# Patient Record
Sex: Female | Born: 1965 | Race: Black or African American | Hispanic: No | Marital: Single | Smoking: Former smoker
Health system: Southern US, Community
[De-identification: ages and names within clinical notes are randomized; demographics above are authoritative.]

## PROBLEM LIST (undated history)

## (undated) DIAGNOSIS — J45909 Unspecified asthma, uncomplicated: Secondary | ICD-10-CM

---

## 1997-05-17 ENCOUNTER — Ambulatory Visit (HOSPITAL_COMMUNITY): Admission: RE | Admit: 1997-05-17 | Discharge: 1997-05-17 | Payer: Self-pay | Admitting: Obstetrics

## 1997-09-09 ENCOUNTER — Emergency Department (HOSPITAL_COMMUNITY): Admission: EM | Admit: 1997-09-09 | Discharge: 1997-09-09 | Payer: Self-pay | Admitting: Emergency Medicine

## 1997-09-09 ENCOUNTER — Ambulatory Visit (HOSPITAL_COMMUNITY): Admission: RE | Admit: 1997-09-09 | Discharge: 1997-09-09 | Payer: Self-pay | Admitting: *Deleted

## 1997-12-23 ENCOUNTER — Ambulatory Visit (HOSPITAL_COMMUNITY): Admission: RE | Admit: 1997-12-23 | Discharge: 1997-12-23 | Payer: Self-pay | Admitting: *Deleted

## 1998-05-18 ENCOUNTER — Ambulatory Visit (HOSPITAL_COMMUNITY): Admission: RE | Admit: 1998-05-18 | Discharge: 1998-05-18 | Payer: Self-pay | Admitting: Obstetrics

## 1998-05-18 ENCOUNTER — Other Ambulatory Visit: Admission: RE | Admit: 1998-05-18 | Discharge: 1998-05-18 | Payer: Self-pay | Admitting: Obstetrics

## 1998-06-09 ENCOUNTER — Emergency Department (HOSPITAL_COMMUNITY): Admission: EM | Admit: 1998-06-09 | Discharge: 1998-06-09 | Payer: Self-pay | Admitting: Emergency Medicine

## 1998-10-31 ENCOUNTER — Emergency Department (HOSPITAL_COMMUNITY): Admission: EM | Admit: 1998-10-31 | Discharge: 1998-10-31 | Payer: Self-pay | Admitting: Emergency Medicine

## 1998-10-31 ENCOUNTER — Encounter: Payer: Self-pay | Admitting: Emergency Medicine

## 1998-12-15 ENCOUNTER — Encounter: Payer: Self-pay | Admitting: Obstetrics

## 1998-12-15 ENCOUNTER — Ambulatory Visit (HOSPITAL_COMMUNITY): Admission: RE | Admit: 1998-12-15 | Discharge: 1998-12-15 | Payer: Self-pay | Admitting: Obstetrics

## 1999-06-22 ENCOUNTER — Emergency Department (HOSPITAL_COMMUNITY): Admission: EM | Admit: 1999-06-22 | Discharge: 1999-06-22 | Payer: Self-pay | Admitting: Emergency Medicine

## 1999-07-17 ENCOUNTER — Other Ambulatory Visit: Admission: RE | Admit: 1999-07-17 | Discharge: 1999-07-17 | Payer: Self-pay | Admitting: Obstetrics

## 1999-10-04 ENCOUNTER — Emergency Department (HOSPITAL_COMMUNITY): Admission: EM | Admit: 1999-10-04 | Discharge: 1999-10-04 | Payer: Self-pay | Admitting: Emergency Medicine

## 1999-10-09 ENCOUNTER — Emergency Department (HOSPITAL_COMMUNITY): Admission: EM | Admit: 1999-10-09 | Discharge: 1999-10-09 | Payer: Self-pay | Admitting: Emergency Medicine

## 1999-10-09 ENCOUNTER — Encounter: Payer: Self-pay | Admitting: Emergency Medicine

## 1999-12-18 ENCOUNTER — Ambulatory Visit (HOSPITAL_COMMUNITY): Admission: RE | Admit: 1999-12-18 | Discharge: 1999-12-18 | Payer: Self-pay | Admitting: Obstetrics

## 1999-12-18 ENCOUNTER — Encounter: Payer: Self-pay | Admitting: Obstetrics

## 2000-04-15 ENCOUNTER — Other Ambulatory Visit: Admission: RE | Admit: 2000-04-15 | Discharge: 2000-04-15 | Payer: Self-pay | Admitting: Obstetrics

## 2000-10-21 ENCOUNTER — Emergency Department (HOSPITAL_COMMUNITY): Admission: EM | Admit: 2000-10-21 | Discharge: 2000-10-21 | Payer: Self-pay | Admitting: Emergency Medicine

## 2001-05-29 ENCOUNTER — Ambulatory Visit (HOSPITAL_BASED_OUTPATIENT_CLINIC_OR_DEPARTMENT_OTHER): Admission: RE | Admit: 2001-05-29 | Discharge: 2001-05-29 | Payer: Self-pay | Admitting: Urology

## 2001-12-03 ENCOUNTER — Ambulatory Visit (HOSPITAL_COMMUNITY): Admission: RE | Admit: 2001-12-03 | Discharge: 2001-12-03 | Payer: Self-pay | Admitting: Obstetrics

## 2001-12-03 ENCOUNTER — Encounter: Payer: Self-pay | Admitting: Obstetrics

## 2003-12-08 ENCOUNTER — Encounter (INDEPENDENT_AMBULATORY_CARE_PROVIDER_SITE_OTHER): Payer: Self-pay | Admitting: *Deleted

## 2003-12-08 LAB — CONVERTED CEMR LAB

## 2004-01-05 ENCOUNTER — Ambulatory Visit: Payer: Self-pay | Admitting: Sports Medicine

## 2004-01-09 ENCOUNTER — Ambulatory Visit: Payer: Self-pay | Admitting: Family Medicine

## 2004-01-12 ENCOUNTER — Ambulatory Visit (HOSPITAL_COMMUNITY): Admission: RE | Admit: 2004-01-12 | Discharge: 2004-01-12 | Payer: Self-pay | Admitting: Family Medicine

## 2004-01-19 ENCOUNTER — Encounter: Admission: RE | Admit: 2004-01-19 | Discharge: 2004-01-19 | Payer: Self-pay | Admitting: Family Medicine

## 2004-05-02 ENCOUNTER — Ambulatory Visit: Payer: Self-pay | Admitting: Family Medicine

## 2004-05-07 ENCOUNTER — Ambulatory Visit: Payer: Self-pay | Admitting: Family Medicine

## 2004-05-09 ENCOUNTER — Ambulatory Visit: Payer: Self-pay | Admitting: Family Medicine

## 2004-05-17 ENCOUNTER — Ambulatory Visit: Payer: Self-pay | Admitting: Family Medicine

## 2004-05-29 ENCOUNTER — Ambulatory Visit: Payer: Self-pay | Admitting: Obstetrics and Gynecology

## 2004-08-07 ENCOUNTER — Ambulatory Visit: Payer: Self-pay | Admitting: Obstetrics and Gynecology

## 2004-08-21 ENCOUNTER — Ambulatory Visit: Payer: Self-pay | Admitting: Obstetrics and Gynecology

## 2005-01-22 ENCOUNTER — Encounter: Payer: Self-pay | Admitting: Obstetrics and Gynecology

## 2005-01-22 ENCOUNTER — Ambulatory Visit: Payer: Self-pay | Admitting: Obstetrics and Gynecology

## 2005-02-18 ENCOUNTER — Ambulatory Visit: Payer: Self-pay | Admitting: Family Medicine

## 2005-04-17 ENCOUNTER — Ambulatory Visit: Payer: Self-pay | Admitting: Family Medicine

## 2005-04-21 ENCOUNTER — Emergency Department (HOSPITAL_COMMUNITY): Admission: EM | Admit: 2005-04-21 | Discharge: 2005-04-21 | Payer: Self-pay | Admitting: Emergency Medicine

## 2005-04-29 ENCOUNTER — Ambulatory Visit: Payer: Self-pay | Admitting: Sports Medicine

## 2005-05-01 ENCOUNTER — Encounter: Admission: RE | Admit: 2005-05-01 | Discharge: 2005-05-01 | Payer: Self-pay | Admitting: Sports Medicine

## 2005-05-07 ENCOUNTER — Ambulatory Visit: Payer: Self-pay | Admitting: Sports Medicine

## 2005-05-09 ENCOUNTER — Ambulatory Visit: Payer: Self-pay | Admitting: Family Medicine

## 2005-05-24 ENCOUNTER — Ambulatory Visit (HOSPITAL_BASED_OUTPATIENT_CLINIC_OR_DEPARTMENT_OTHER): Admission: RE | Admit: 2005-05-24 | Discharge: 2005-05-24 | Payer: Self-pay | Admitting: Family Medicine

## 2005-06-02 ENCOUNTER — Ambulatory Visit: Payer: Self-pay | Admitting: Internal Medicine

## 2005-06-03 ENCOUNTER — Ambulatory Visit: Payer: Self-pay | Admitting: Family Medicine

## 2005-06-24 ENCOUNTER — Ambulatory Visit: Payer: Self-pay | Admitting: Family Medicine

## 2005-09-05 ENCOUNTER — Ambulatory Visit: Payer: Self-pay | Admitting: Obstetrics and Gynecology

## 2005-09-05 ENCOUNTER — Ambulatory Visit (HOSPITAL_COMMUNITY): Admission: RE | Admit: 2005-09-05 | Discharge: 2005-09-05 | Payer: Self-pay | Admitting: Obstetrics and Gynecology

## 2005-10-31 ENCOUNTER — Ambulatory Visit: Payer: Self-pay | Admitting: Obstetrics and Gynecology

## 2005-11-29 ENCOUNTER — Observation Stay (HOSPITAL_COMMUNITY): Admission: EM | Admit: 2005-11-29 | Discharge: 2005-11-30 | Payer: Self-pay | Admitting: Emergency Medicine

## 2005-12-13 ENCOUNTER — Ambulatory Visit: Payer: Self-pay | Admitting: Hematology and Oncology

## 2005-12-16 ENCOUNTER — Encounter: Admission: RE | Admit: 2005-12-16 | Discharge: 2005-12-16 | Payer: Self-pay | Admitting: Cardiology

## 2005-12-19 LAB — CBC WITH DIFFERENTIAL/PLATELET
BASO%: 0.4 % (ref 0.0–2.0)
Basophils Absolute: 0 10*3/uL (ref 0.0–0.1)
EOS%: 1.1 % (ref 0.0–7.0)
Eosinophils Absolute: 0.1 10*3/uL (ref 0.0–0.5)
HCT: 42.2 % (ref 34.8–46.6)
HGB: 14.5 g/dL (ref 11.6–15.9)
LYMPH%: 35.2 % (ref 14.0–48.0)
MCH: 32 pg (ref 26.0–34.0)
MCHC: 34.4 g/dL (ref 32.0–36.0)
MCV: 93.3 fL (ref 81.0–101.0)
MONO#: 0.5 10*3/uL (ref 0.1–0.9)
MONO%: 6.6 % (ref 0.0–13.0)
NEUT#: 4.4 10*3/uL (ref 1.5–6.5)
NEUT%: 56.7 % (ref 39.6–76.8)
Platelets: 341 10*3/uL (ref 145–400)
RBC: 4.52 10*6/uL (ref 3.70–5.32)
RDW: 12.8 % (ref 11.3–14.5)
WBC: 7.7 10*3/uL (ref 3.9–10.0)
lymph#: 2.7 10*3/uL (ref 0.9–3.3)

## 2005-12-20 LAB — HEPATITIS B CORE ANTIBODY, IGM: Hep B C IgM: NEGATIVE

## 2005-12-20 LAB — HEPATITIS B SURFACE ANTIGEN: Hepatitis B Surface Ag: NEGATIVE

## 2005-12-20 LAB — HEPATITIS B SURFACE ANTIBODY,QUALITATIVE: Hep B S Ab: POSITIVE — AB

## 2005-12-20 LAB — ANA: Anti Nuclear Antibody(ANA): NEGATIVE

## 2005-12-20 LAB — HEPATITIS C ANTIBODY: HCV Ab: NEGATIVE

## 2005-12-23 LAB — COMPREHENSIVE METABOLIC PANEL
ALT: 16 U/L (ref 0–40)
AST: 14 U/L (ref 0–37)
Albumin: 4.1 g/dL (ref 3.5–5.2)
Alkaline Phosphatase: 52 U/L (ref 39–117)
BUN: 13 mg/dL (ref 6–23)
CO2: 27 mEq/L (ref 19–32)
Calcium: 9.1 mg/dL (ref 8.4–10.5)
Chloride: 106 mEq/L (ref 96–112)
Creatinine, Ser: 0.83 mg/dL (ref 0.40–1.20)
Glucose, Bld: 114 mg/dL — ABNORMAL HIGH (ref 70–99)
Potassium: 4.1 mEq/L (ref 3.5–5.3)
Sodium: 140 mEq/L (ref 135–145)
Total Bilirubin: 0.6 mg/dL (ref 0.3–1.2)
Total Protein: 6.6 g/dL (ref 6.0–8.3)

## 2005-12-23 LAB — LACTATE DEHYDROGENASE: LDH: 170 U/L (ref 94–250)

## 2005-12-24 LAB — OTHER SOLSTAS TEST

## 2006-03-12 ENCOUNTER — Encounter: Payer: Self-pay | Admitting: Obstetrics and Gynecology

## 2006-03-12 ENCOUNTER — Ambulatory Visit: Payer: Self-pay | Admitting: Obstetrics & Gynecology

## 2006-04-18 ENCOUNTER — Emergency Department (HOSPITAL_COMMUNITY): Admission: EM | Admit: 2006-04-18 | Discharge: 2006-04-18 | Payer: Self-pay | Admitting: Emergency Medicine

## 2006-05-05 ENCOUNTER — Ambulatory Visit (HOSPITAL_COMMUNITY): Admission: RE | Admit: 2006-05-05 | Discharge: 2006-05-05 | Payer: Self-pay | Admitting: Family Medicine

## 2006-06-05 DIAGNOSIS — G2589 Other specified extrapyramidal and movement disorders: Secondary | ICD-10-CM | POA: Insufficient documentation

## 2006-06-05 DIAGNOSIS — E669 Obesity, unspecified: Secondary | ICD-10-CM | POA: Insufficient documentation

## 2006-06-05 DIAGNOSIS — K219 Gastro-esophageal reflux disease without esophagitis: Secondary | ICD-10-CM | POA: Insufficient documentation

## 2006-06-05 DIAGNOSIS — N946 Dysmenorrhea, unspecified: Secondary | ICD-10-CM | POA: Insufficient documentation

## 2006-06-05 DIAGNOSIS — G4730 Sleep apnea, unspecified: Secondary | ICD-10-CM | POA: Insufficient documentation

## 2006-06-05 DIAGNOSIS — G43909 Migraine, unspecified, not intractable, without status migrainosus: Secondary | ICD-10-CM | POA: Insufficient documentation

## 2006-06-06 ENCOUNTER — Encounter (INDEPENDENT_AMBULATORY_CARE_PROVIDER_SITE_OTHER): Payer: Self-pay | Admitting: *Deleted

## 2006-07-15 ENCOUNTER — Telehealth: Payer: Self-pay | Admitting: *Deleted

## 2006-07-15 ENCOUNTER — Ambulatory Visit: Payer: Self-pay | Admitting: Family Medicine

## 2006-07-15 ENCOUNTER — Encounter (INDEPENDENT_AMBULATORY_CARE_PROVIDER_SITE_OTHER): Payer: Self-pay | Admitting: Family Medicine

## 2006-07-15 DIAGNOSIS — N920 Excessive and frequent menstruation with regular cycle: Secondary | ICD-10-CM | POA: Insufficient documentation

## 2006-07-15 DIAGNOSIS — R1032 Left lower quadrant pain: Secondary | ICD-10-CM | POA: Insufficient documentation

## 2006-07-15 LAB — CONVERTED CEMR LAB
Bilirubin Urine: NEGATIVE
Glucose, Urine, Semiquant: NEGATIVE
HCT: 41.6 %
Hemoglobin: 14.2 g/dL
MCV: 93.2 fL
Nitrite: NEGATIVE
Platelets: 315 10*3/uL
Protein, U semiquant: 30
RBC: 4.46 M/uL
Specific Gravity, Urine: >=1.03
Urobilinogen, UA: 0.2
WBC Urine, dipstick: NEGATIVE
WBC: 11.9 10*3/uL
pH: 6.5

## 2006-07-16 ENCOUNTER — Encounter: Payer: Self-pay | Admitting: *Deleted

## 2006-07-16 ENCOUNTER — Telehealth: Payer: Self-pay | Admitting: *Deleted

## 2006-07-16 ENCOUNTER — Emergency Department (HOSPITAL_COMMUNITY): Admission: EM | Admit: 2006-07-16 | Discharge: 2006-07-16 | Payer: Self-pay | Admitting: Family Medicine

## 2006-07-16 DIAGNOSIS — R109 Unspecified abdominal pain: Secondary | ICD-10-CM | POA: Insufficient documentation

## 2006-07-18 ENCOUNTER — Other Ambulatory Visit: Admission: RE | Admit: 2006-07-18 | Discharge: 2006-07-18 | Payer: Self-pay | Admitting: Obstetrics and Gynecology

## 2006-07-18 ENCOUNTER — Ambulatory Visit: Payer: Self-pay | Admitting: Family Medicine

## 2006-07-18 ENCOUNTER — Encounter (INDEPENDENT_AMBULATORY_CARE_PROVIDER_SITE_OTHER): Payer: Self-pay | Admitting: Specialist

## 2006-07-21 ENCOUNTER — Ambulatory Visit (HOSPITAL_COMMUNITY): Admission: RE | Admit: 2006-07-21 | Discharge: 2006-07-21 | Payer: Self-pay | Admitting: Family Medicine

## 2006-07-30 ENCOUNTER — Ambulatory Visit: Payer: Self-pay | Admitting: Family Medicine

## 2006-08-07 ENCOUNTER — Ambulatory Visit: Payer: Self-pay | Admitting: Sports Medicine

## 2006-08-07 ENCOUNTER — Encounter: Payer: Self-pay | Admitting: Family Medicine

## 2006-08-07 DIAGNOSIS — M549 Dorsalgia, unspecified: Secondary | ICD-10-CM | POA: Insufficient documentation

## 2006-08-07 DIAGNOSIS — J309 Allergic rhinitis, unspecified: Secondary | ICD-10-CM | POA: Insufficient documentation

## 2006-09-03 ENCOUNTER — Ambulatory Visit: Payer: Self-pay | Admitting: Family Medicine

## 2006-09-03 ENCOUNTER — Inpatient Hospital Stay (HOSPITAL_COMMUNITY): Admission: RE | Admit: 2006-09-03 | Discharge: 2006-09-05 | Payer: Self-pay | Admitting: Family Medicine

## 2006-09-03 ENCOUNTER — Encounter: Payer: Self-pay | Admitting: Family Medicine

## 2006-09-24 ENCOUNTER — Ambulatory Visit: Payer: Self-pay | Admitting: Obstetrics & Gynecology

## 2006-10-03 ENCOUNTER — Telehealth: Payer: Self-pay | Admitting: Family Medicine

## 2006-10-03 ENCOUNTER — Inpatient Hospital Stay (HOSPITAL_COMMUNITY): Admission: AD | Admit: 2006-10-03 | Discharge: 2006-10-03 | Payer: Self-pay | Admitting: Family Medicine

## 2006-10-21 ENCOUNTER — Ambulatory Visit: Payer: Self-pay | Admitting: Family Medicine

## 2006-10-21 ENCOUNTER — Encounter: Payer: Self-pay | Admitting: Family Medicine

## 2006-10-21 DIAGNOSIS — F329 Major depressive disorder, single episode, unspecified: Secondary | ICD-10-CM | POA: Insufficient documentation

## 2006-10-22 ENCOUNTER — Encounter: Admission: RE | Admit: 2006-10-22 | Discharge: 2006-10-23 | Payer: Self-pay | Admitting: Family Medicine

## 2006-10-23 ENCOUNTER — Ambulatory Visit: Payer: Self-pay | Admitting: Family Medicine

## 2006-10-23 ENCOUNTER — Ambulatory Visit: Payer: Self-pay | Admitting: Obstetrics and Gynecology

## 2006-10-26 ENCOUNTER — Encounter: Payer: Self-pay | Admitting: Family Medicine

## 2006-10-26 ENCOUNTER — Telehealth: Payer: Self-pay | Admitting: Family Medicine

## 2006-11-14 ENCOUNTER — Encounter: Payer: Self-pay | Admitting: *Deleted

## 2006-11-20 ENCOUNTER — Ambulatory Visit: Payer: Self-pay | Admitting: *Deleted

## 2006-11-27 ENCOUNTER — Telehealth: Payer: Self-pay | Admitting: *Deleted

## 2006-11-28 ENCOUNTER — Encounter (INDEPENDENT_AMBULATORY_CARE_PROVIDER_SITE_OTHER): Payer: Self-pay | Admitting: *Deleted

## 2006-11-28 ENCOUNTER — Ambulatory Visit: Payer: Self-pay | Admitting: Family Medicine

## 2006-12-04 ENCOUNTER — Encounter: Payer: Self-pay | Admitting: Family Medicine

## 2006-12-04 ENCOUNTER — Ambulatory Visit: Payer: Self-pay | Admitting: Family Medicine

## 2006-12-04 DIAGNOSIS — R5381 Other malaise: Secondary | ICD-10-CM

## 2006-12-04 DIAGNOSIS — R5383 Other fatigue: Secondary | ICD-10-CM | POA: Insufficient documentation

## 2006-12-04 DIAGNOSIS — N760 Acute vaginitis: Secondary | ICD-10-CM | POA: Insufficient documentation

## 2006-12-04 LAB — CONVERTED CEMR LAB
HCT: 42.2 % (ref 36.0–46.0)
Hemoglobin: 14.3 g/dL (ref 12.0–15.0)
MCHC: 33.9 g/dL (ref 30.0–36.0)
MCV: 91.9 fL (ref 78.0–100.0)
Platelets: 322 10*3/uL (ref 150–400)
RBC: 4.59 M/uL (ref 3.87–5.11)
RDW: 13.4 % (ref 11.5–14.0)
WBC: 8.6 10*3/uL (ref 4.0–10.5)

## 2006-12-05 ENCOUNTER — Telehealth: Payer: Self-pay | Admitting: Family Medicine

## 2006-12-10 ENCOUNTER — Encounter: Payer: Self-pay | Admitting: Family Medicine

## 2007-01-22 ENCOUNTER — Telehealth: Payer: Self-pay | Admitting: *Deleted

## 2007-01-28 ENCOUNTER — Ambulatory Visit: Payer: Self-pay | Admitting: Family Medicine

## 2007-01-28 ENCOUNTER — Encounter: Payer: Self-pay | Admitting: Family Medicine

## 2007-01-28 DIAGNOSIS — H538 Other visual disturbances: Secondary | ICD-10-CM | POA: Insufficient documentation

## 2007-01-28 LAB — CONVERTED CEMR LAB
Cholesterol: 189 mg/dL (ref 0–200)
HDL: 65 mg/dL (ref >39)
LDL Cholesterol: 115 mg/dL — ABNORMAL HIGH (ref 0–99)
TSH: 1.712 microintl units/mL (ref 0.350–5.50)
Total CHOL/HDL Ratio: 2.9
Triglycerides: 45 mg/dL (ref <150)
VLDL: 9 mg/dL (ref 0–40)

## 2007-02-03 ENCOUNTER — Telehealth: Payer: Self-pay | Admitting: *Deleted

## 2007-02-05 ENCOUNTER — Telehealth: Payer: Self-pay | Admitting: *Deleted

## 2007-03-02 ENCOUNTER — Telehealth: Payer: Self-pay | Admitting: *Deleted

## 2007-03-24 ENCOUNTER — Telehealth: Payer: Self-pay | Admitting: *Deleted

## 2007-03-26 ENCOUNTER — Encounter: Payer: Self-pay | Admitting: Family Medicine

## 2007-03-26 ENCOUNTER — Ambulatory Visit: Payer: Self-pay | Admitting: Family Medicine

## 2007-03-26 DIAGNOSIS — A6000 Herpesviral infection of urogenital system, unspecified: Secondary | ICD-10-CM | POA: Insufficient documentation

## 2007-03-31 ENCOUNTER — Encounter: Payer: Self-pay | Admitting: Family Medicine

## 2007-04-13 ENCOUNTER — Telehealth: Payer: Self-pay | Admitting: *Deleted

## 2007-04-15 ENCOUNTER — Ambulatory Visit: Payer: Self-pay | Admitting: Family Medicine

## 2007-04-15 ENCOUNTER — Encounter (INDEPENDENT_AMBULATORY_CARE_PROVIDER_SITE_OTHER): Payer: Self-pay | Admitting: Family Medicine

## 2007-04-15 LAB — CONVERTED CEMR LAB
Chlamydia, DNA Probe: NEGATIVE
GC Probe Amp, Genital: NEGATIVE
Whiff Test: NEGATIVE

## 2007-04-16 ENCOUNTER — Telehealth: Payer: Self-pay | Admitting: *Deleted

## 2007-04-21 ENCOUNTER — Telehealth (INDEPENDENT_AMBULATORY_CARE_PROVIDER_SITE_OTHER): Payer: Self-pay | Admitting: Family Medicine

## 2007-05-12 ENCOUNTER — Telehealth (INDEPENDENT_AMBULATORY_CARE_PROVIDER_SITE_OTHER): Payer: Self-pay | Admitting: *Deleted

## 2007-06-05 ENCOUNTER — Ambulatory Visit (HOSPITAL_COMMUNITY): Admission: RE | Admit: 2007-06-05 | Discharge: 2007-06-05 | Payer: Self-pay | Admitting: Family Medicine

## 2007-08-25 ENCOUNTER — Telehealth: Payer: Self-pay | Admitting: *Deleted

## 2007-09-21 ENCOUNTER — Telehealth (INDEPENDENT_AMBULATORY_CARE_PROVIDER_SITE_OTHER): Payer: Self-pay | Admitting: *Deleted

## 2007-12-01 ENCOUNTER — Telehealth: Payer: Self-pay | Admitting: *Deleted

## 2007-12-02 ENCOUNTER — Encounter (INDEPENDENT_AMBULATORY_CARE_PROVIDER_SITE_OTHER): Payer: Self-pay | Admitting: *Deleted

## 2008-01-23 IMAGING — CR DG CHEST 2V
2 series · 2 of 2 positions shown · non-contrast
Comparison: none

CLINICAL DATA: Severe night sweats.  Nonsmoker.
 CHEST - 2 VIEW: 
 PA and lateral chest -  09/05/05. 
 No prior studies are available for comparison.

[view not recorded (1 of 2)]
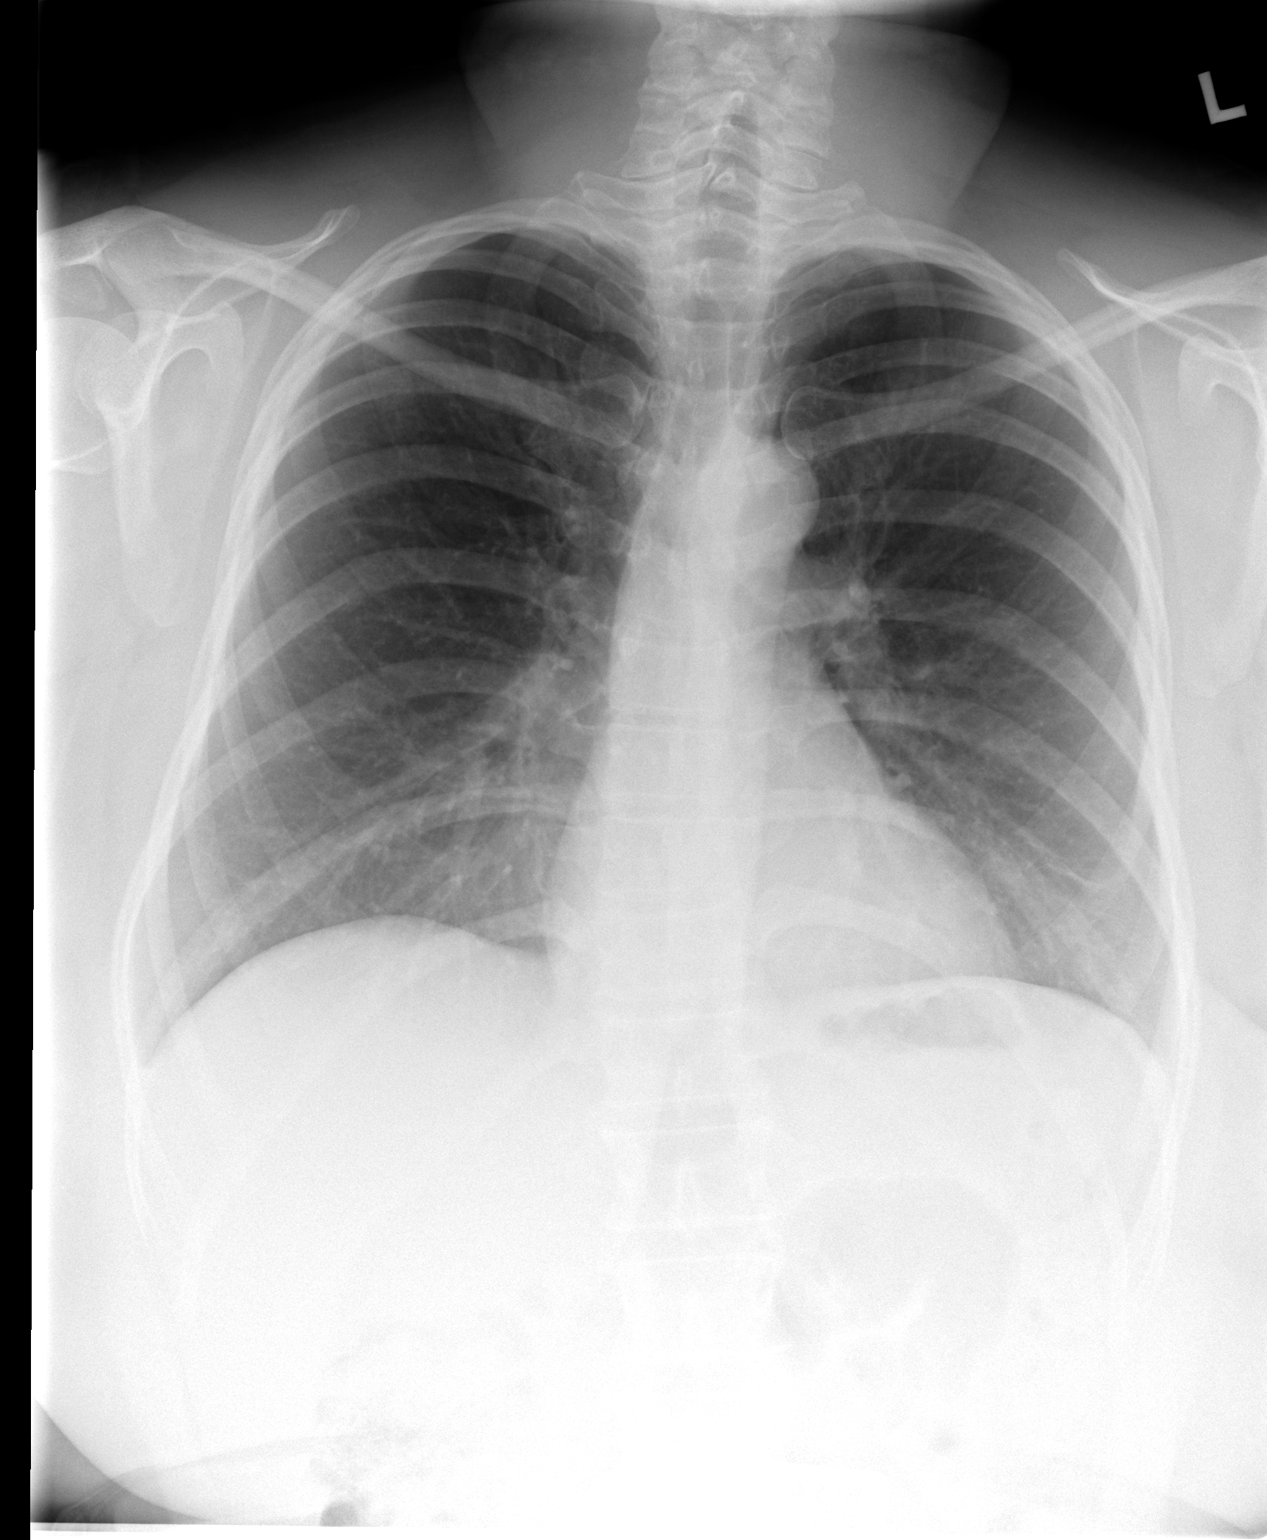

[view not recorded (2 of 2)]
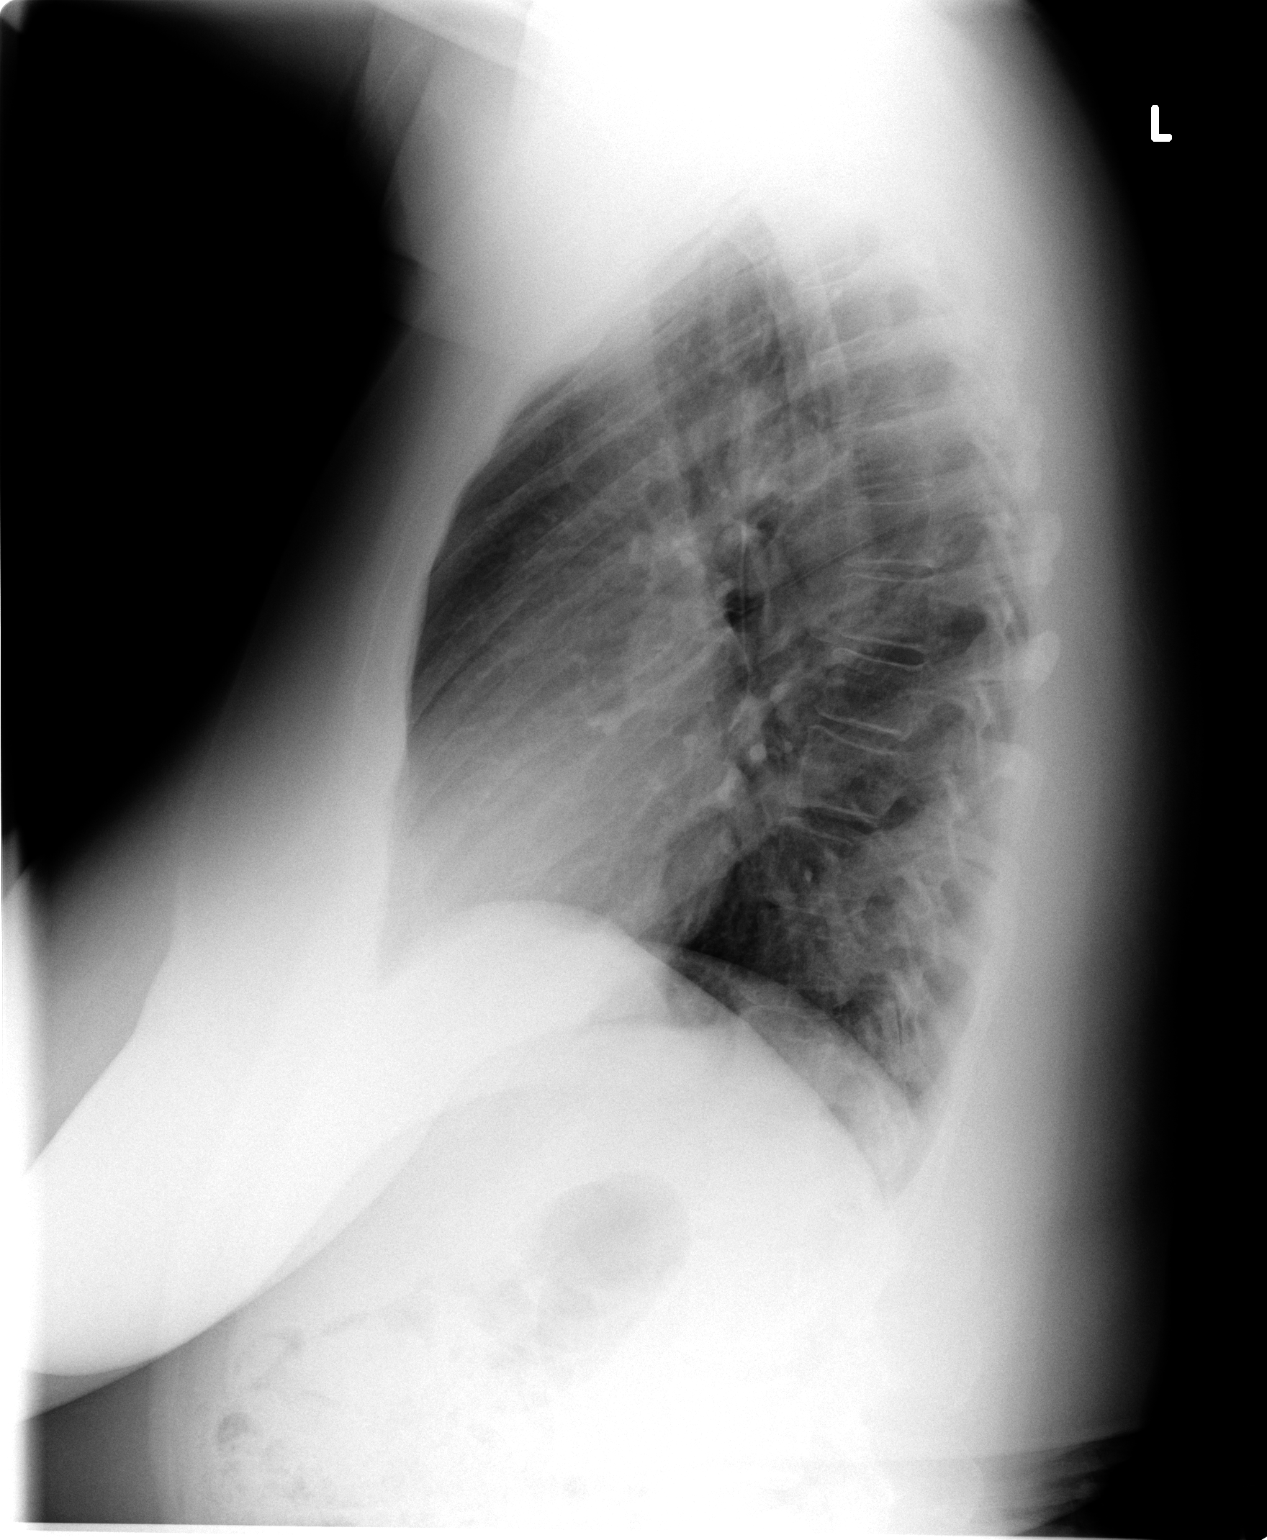

[2 of 2 positions shown; findings below may reference images not displayed]

FINDINGS: The heart size and mediastinal contours are within normal limits.  Both lungs are clear.  The visualized skeletal structures are within normal limits.
IMPRESSION: No active cardiopulmonary disease. 
 No radiographic stigmata for tuberculosis noted.

## 2008-04-28 ENCOUNTER — Telehealth: Payer: Self-pay | Admitting: *Deleted

## 2008-07-19 ENCOUNTER — Telehealth: Payer: Self-pay | Admitting: *Deleted

## 2008-12-07 IMAGING — US US TRANSVAGINAL NON-OB
1 series · 13 of 25 positions shown · non-contrast
Comparison: Correlation is made with the previous exam on 01/12/04.

CLINICAL DATA: Pain. Evaluate fibroids. 
 TRANSABDOMINAL AND TRANSVAGINAL PELVIC ULTRASOUND:
TECHNIQUE: Both transabdominal and transvaginal ultrasound examinations of the pelvis were performed including evaluation of the uterus, ovaries, adnexal regions, and pelvic cul-de-sac.

[Series 1: us transvaginal non-ob · 0.33mm/px · 13 of 55 slices shown]
[im 1/55]
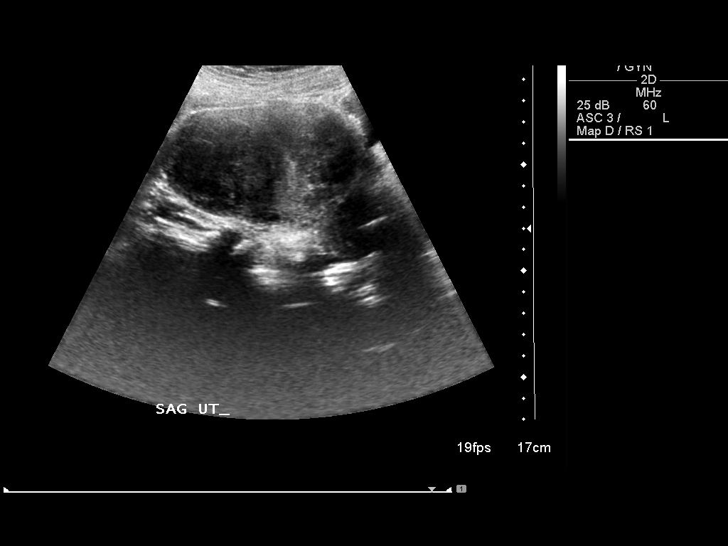
[im 5/55]
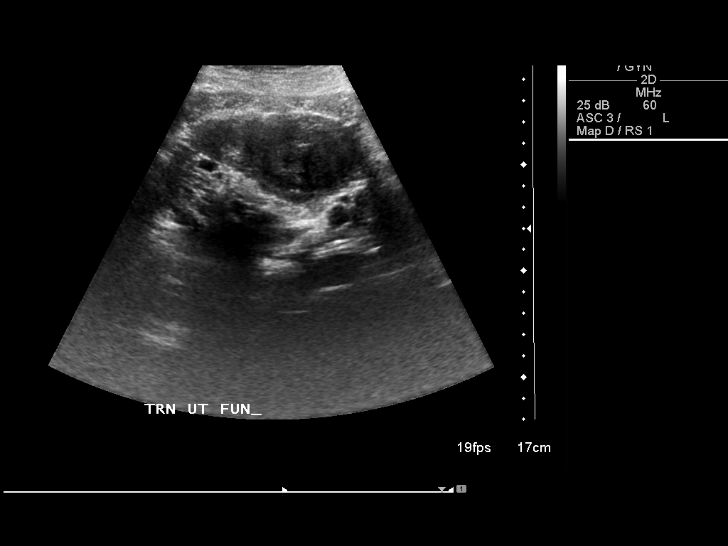
[im 10/55]
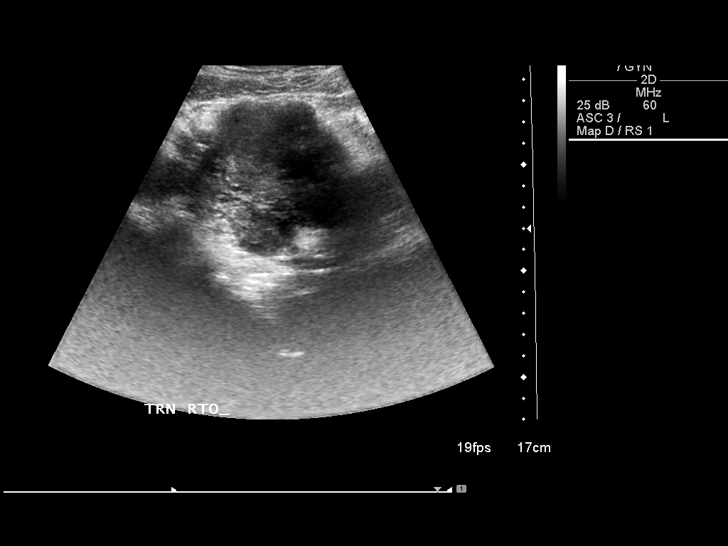
[im 14/55]
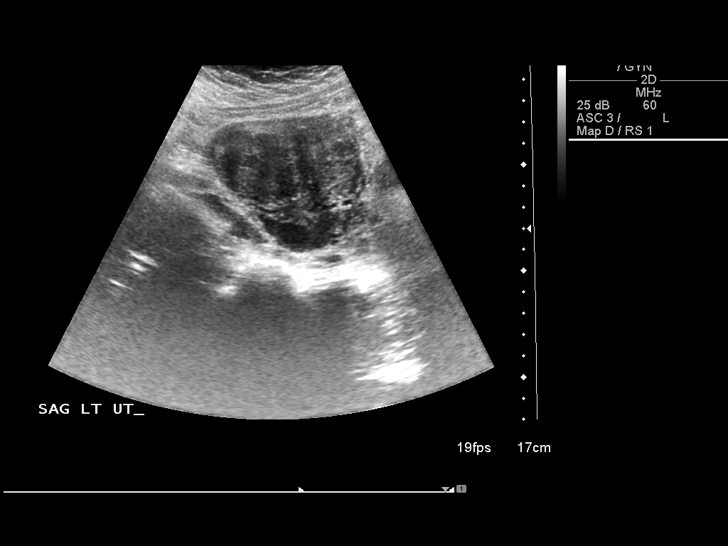
[im 19/55]
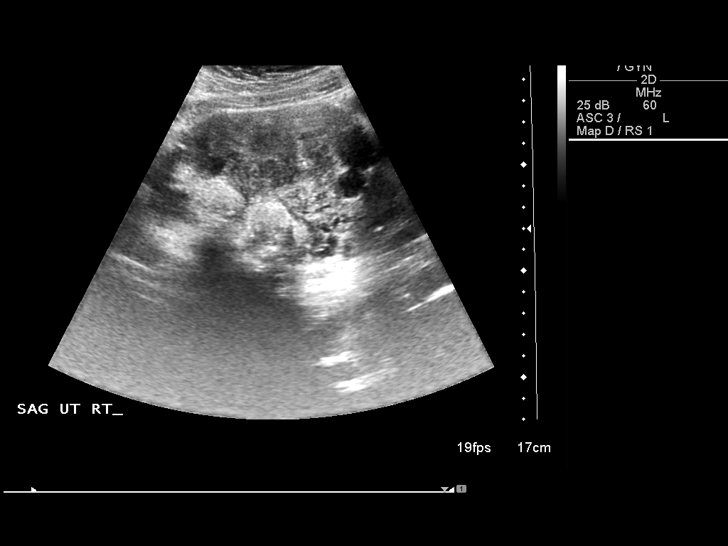
[im 23/55]
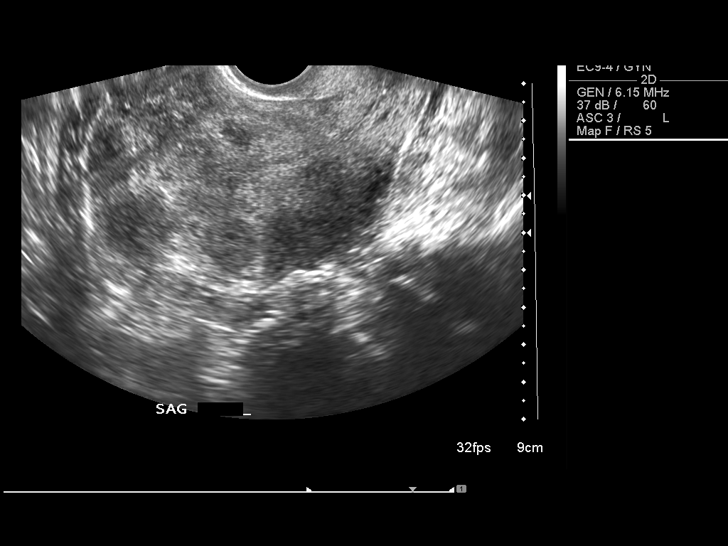
[im 28/55]
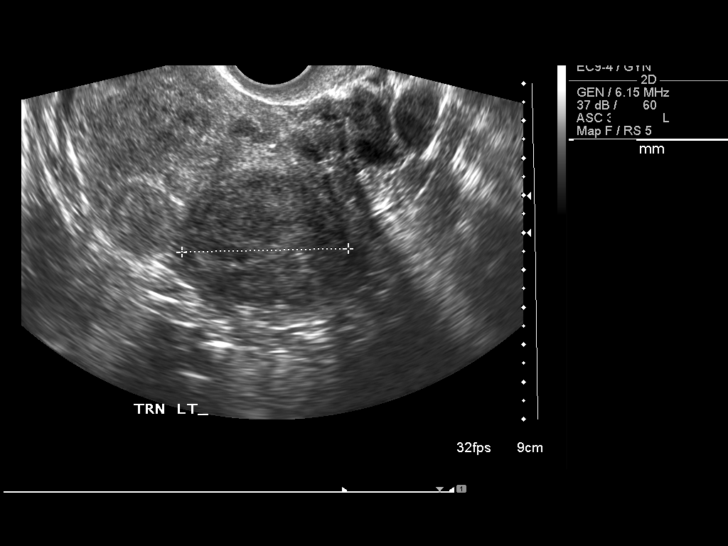
[im 32/55]
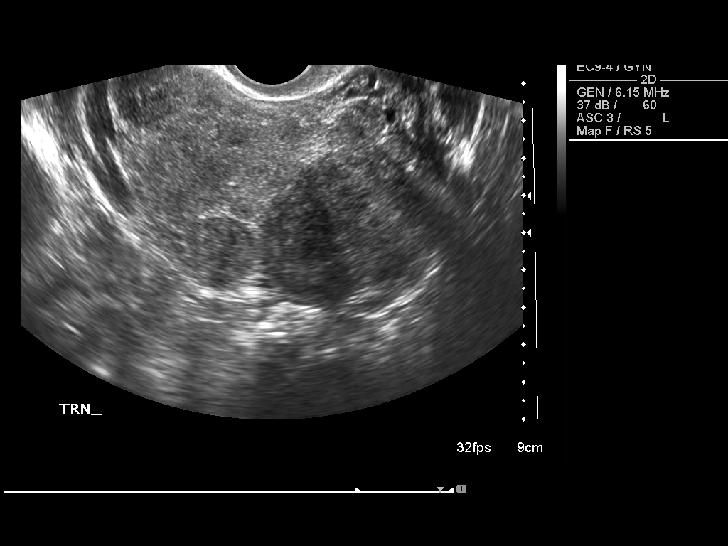
[im 37/55]
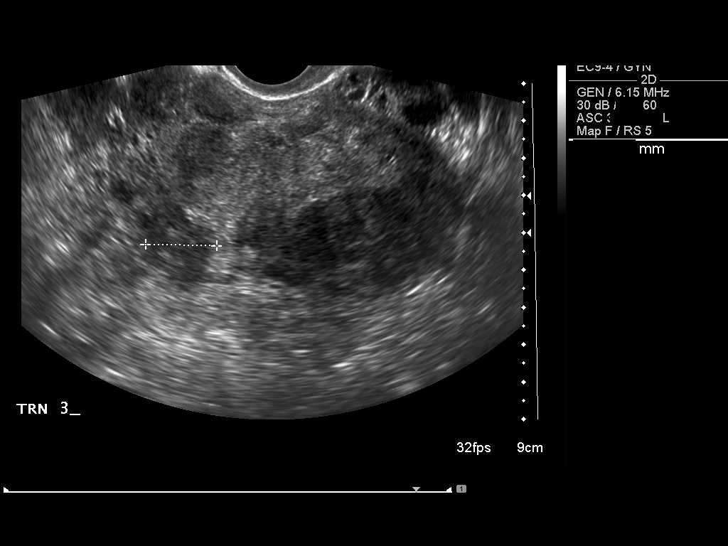
[im 41/55]
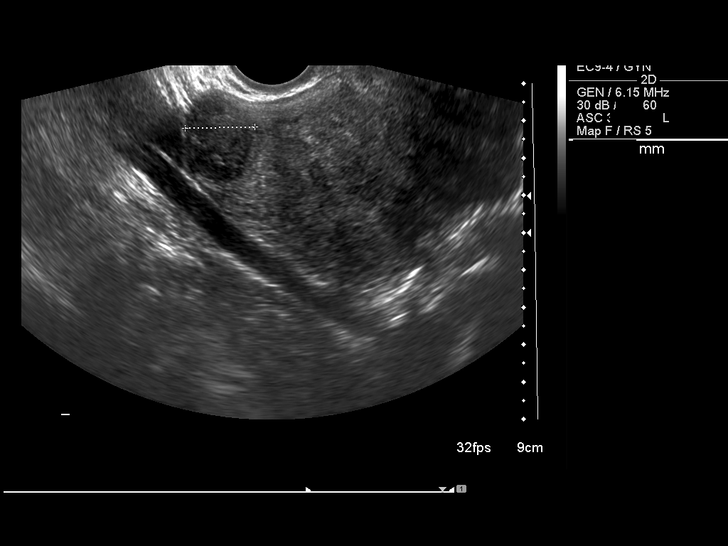
[im 46/55]
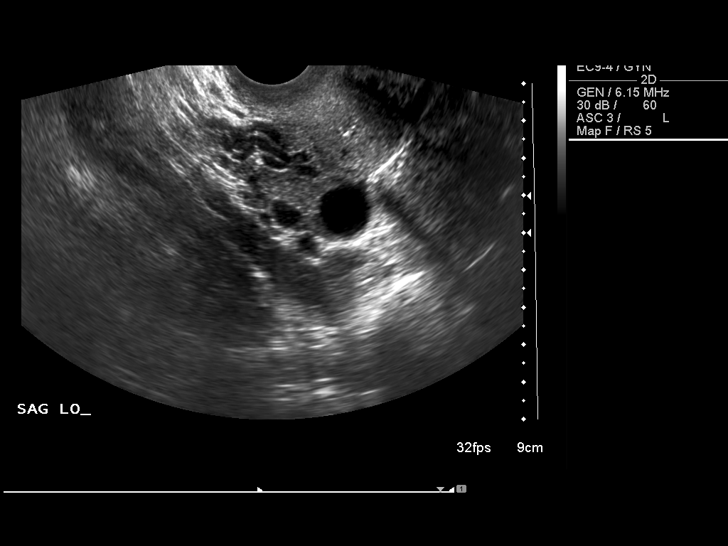
[im 50/55]
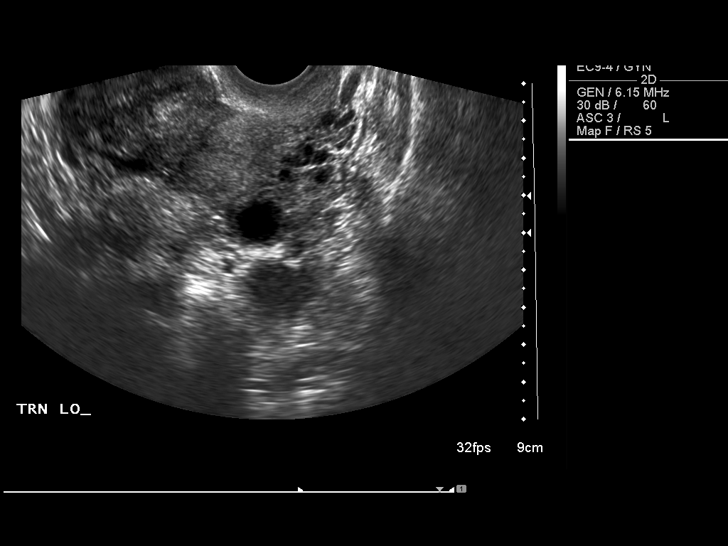
[im 55/55]
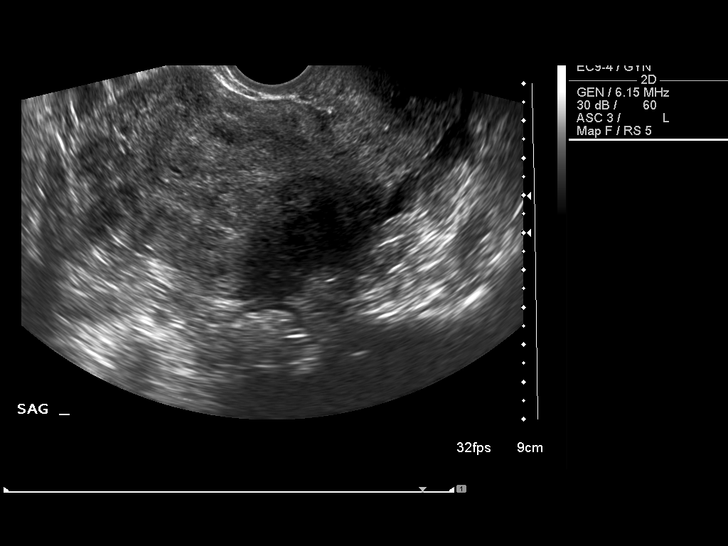

[13 of 25 positions shown; findings below may reference images not displayed]

FINDINGS: Multiple images of the uterus and adnexa were obtained using transabdominal and endovaginal approaches. The uterus is enlarged with a sagittal length of 12.4 cm, an AP width of 6.2 cm, and a transverse width of 8.3 cm.  Multiple areas of focally altered echotexture are identified compatible with focal fibroids with the largest located in the posterior upper uterine segment measuring 4.8 x 3.6 x 4.5 cm.  This fibroid has a partial serosal component.  Other fibroids are identified in the fundus measuring 1.9 x 1.5 x 1.9 cm and the right fundal region measuring 2.3 x 2.2 x 1.9 cm and in the right lateral lower uterine segment measuring 2.1 x 1.1 x 1.8 cm.  More diffuse uterine myometrial inhomogeneity is seen compatible with more diffuse fibroid involvement.  All of the remaining fibroids appear to be predominantly mural in location. 
 The endometrial canal appears thin and echogenic with a single layer measurement of 2.4 mm.  There is some hypoechoic material identified within the fundal region of the endometrial canal compatible with a small amount of blood. This would correlate with the patient?s current spotting and given LMP of 07/12/06. No evidence for submucosal involvement of the any aforementioned fibroids is seen.
 In comparison with the prior exam, the three fibroids clearly identified previously demonstrate little interval change in overall size compared with this most recent exam. 
 Both ovaries are seen with the right ovary measuring 3.8 x 2.4 x 1.9 cm and the left ovary measuring 3.7 x 1.8 x 2.4 cm. The left ovary contains a dominant follicle.  No significant pelvic fluid is seen and no definite separate adnexal masses are noted.
 Because of the prominent uterine size, evaluation of the kidneys is undertaken too assess for the presence of associated hydronephrosis. No signs of associated hydronephrosis are noted.
IMPRESSION: Fibroid uterus with fibroid sizes and locations as noted above.  Little interval change in overall size is noted in comparison with the prior exam.  Additional fibroids, however, could be resolved on today?s exam as compared with the previous evaluation.

## 2009-06-06 ENCOUNTER — Encounter: Payer: Self-pay | Admitting: Family Medicine

## 2010-04-29 ENCOUNTER — Encounter: Payer: Self-pay | Admitting: Family Medicine

## 2010-05-08 NOTE — Miscellaneous (Signed)
Summary: Clean up old acute medications  Clinical Lists Changes  Medications: Removed medication of IBUPROFEN 800 MG TABS (IBUPROFEN) Take 1 tablet by mouth three times a day Removed medication of DIFLUCAN 150 MG TABS (FLUCONAZOLE) 1 tab by mouth today and if symptoms persist may repeat in 3 days Removed medication of VICODIN 5-500 MG  TABS (HYDROCODONE-ACETAMINOPHEN) take one every 6 hours as needed for pain Removed medication of TINACTIN 1 %  CREA (TOLNAFTATE) small amount two times a day x 10 days. 1 tube 15 gram Removed medication of ACYCLOVIR 400 MG  TABS (ACYCLOVIR) 1 tab by mouth three times a day x 5 days

## 2010-08-24 NOTE — Group Therapy Note (Signed)
NAME:  Holly Clarke, Holly Clarke NO.:  [id number]   MEDICAL RECORD NO.:  [id number]          PATIENT TYPE:  WOC   LOCATION:  [facility]                   FACILITY:  [facility]   PHYSICIAN:  Dorthula Perfect, MD     DATE OF BIRTH:  Jul 25, 1965   DATE OF SERVICE:  03/12/2006                                  CLINIC NOTE   A 45 year old black female gravida 1, para 1 who presents for annual  breast exam and Pap smear.  Her menstrual periods of cyclic with the  first few days being described as heavy followed by spotting.  She is on  no hormone medicine at this time.  She is having a weight problem which  I have discussed below.   FAMILY HISTORY:  Negative for breast cancer, colon cancer and ovarian  cancer.  The patient had a mammogram earlier this year which was normal.   PHYSICAL EXAMINATION:  Height 4 foot 11, weight 176.  Blood pressure  136/80.  Thyroid is normal.  Breasts are bilaterally normal.  There are  no masses noted.  Abdomen is obese and nontender.  PELVIC EXAMINATION:  External genitalia and BUS glands were normal.  Vaginal vault was epithelialized as was the cervix.  The uterus is  anteflexed and is normal.  Ovaries are bilaterally normal.  No masses  are noted.   IMPRESSION:  1. Normal gynecologic exam.  2. Exogenous obesity.   DISPOSITION:  Pap smear.   The patient was here in July.  She had lab work that included normal  chemistry, normal FSH and LH, normal hemoglobin and hematocrit and  normal thyroid.  The patient works out for an hour three times a week.  She has seen a nutritionist who advised her to be on a 3000 to 5000  calorie intake per day.  I discussed with her the fallacy of that when  she is wanting to lose weight.  I have recommended to her that she for  at least the next few weeks count her calories, look at her  carbohydrates and try to limit her intake to no more than 1800 calories  a day. She should continue with her exercise program.          ______________________________  Dorthula Perfect, MD     ER/MEDQ  D:  03/12/2006  T:  03/12/2006  Job:  [id number]

## 2010-08-24 NOTE — Procedures (Signed)
NAME:  Holly Clarke, Holly Clarke               ACCOUNT NO.:  [id number]   MEDICAL RECORD NO.:  [id number]          PATIENT TYPE:  OUT   LOCATION:  S[facility] CENTER                 FACILITY:  [facility] ([place])   PHYSICIAN:  Clinton D. Maple Hudson, M.D. DATE OF BIRTH:  11-02-1965   DATE OF STUDY:  05/24/2005                              NOCTURNAL POLYSOMNOGRAM   REFERRING PHYSICIAN:  Dr. Doralee Albino   DATE OF STUDY:  May 24, 2005   INDICATION FOR STUDY:  Insomnia with sleep apnea. Epworth sleepiness score  14/24, BMI 33.6, weight 167 pounds. Home medications: Celebrex, acyclovir,  Robaxin, birth control pills.   SLEEP ARCHITECTURE:  Total sleep time 422 minutes with sleep efficiency 90%.  Stage I was 7%, stage II 53%, stages III and IV 9%, REM 31% of total sleep  time. Sleep latency 18 minutes, REM latency 71 minutes, awake after sleep  onset 30 minutes, arousal index 39 indicating fragmentation. Robaxin was  taken at 2115 hours.   RESPIRATORY DATA:  Apnea-hypopnea index (AHI, RDI) 7.7 obstructive events  per hour indicating mild obstructive sleep apnea/hypopnea syndrome. This  included 2 central apneas, 1 obstructive apnea and 51 hypopneas. Events were  not positional. REM AHI 23 per hour. There were insufficient obstructive  events to qualify for CPAP titration by split protocol on this study night.   OXYGEN DATA:  Moderate to loud snoring with oxygen desaturation to a nadir  of 84%. Mean oxygen saturation through the study was 95% on room air.   CARDIAC DATA:  Normal sinus rhythm.   MOVEMENT/PARASOMNIA:  A total of 158 limb jerks were recorded of which 43  were associated with arousal or awakening for a periodic limb movement with  arousal index of 6.1 per hour which is increased.   IMPRESSION/RECOMMENDATION:  1.  Mild obstructive sleep apnea/hypopnea syndrome, apnea-hypopnea index 7.7      per hour. Events were not positional, more frequent in rapid eye      movement. Scores in this range are below  what is usually considered for      continuous positive airway pressure therapy unless more conservative      measures fail. Suggest the patient be encouraged to lose weight, treat      any significant nasal congestion and sleep off flat of back.  2.  Periodic limb movement with arousal, 6.1 per hour. This may be      appropriate for therapeutic trial such as Requip or Mirapex depending on      clinical situation.  3.  Increase sleep fragmentation with an arousal index of 39 per hour. This      likely corresponds to the patient's      impression that she wakes frequently and reflects sleep disturbance from      both mild sleep apnea and from periodic limb movement.      Clinton D. Maple Hudson, M.D.  Diplomate, Biomedical engineer of Sleep Medicine  Electronically Signed     CDY/MEDQ  D:  06/02/2005 08:58:55  T:  06/02/2005 18:57:14  Job:  [id number]

## 2010-08-24 NOTE — Group Therapy Note (Signed)
NAME:  Holly Clarke, Holly Clarke NO.:  [id number]   MEDICAL RECORD NO.:  [id number]          PATIENT TYPE:  WOC   LOCATION:  [facility]                   FACILITY:  [facility]   PHYSICIAN:  Argentina Donovan, MD        DATE OF BIRTH:  1965-09-10   DATE OF SERVICE:  10/31/2005                                    CLINIC NOTE   CLINIC NOTE   DATE OF VISIT:  October 31, 2005   SUBJECTIVE:  The patient is a 45 year old gravida 1, para 1-0-0-1 who, from  her last visit, will notice has extreme hot flashes and night sweats, soaks  her bed.  She at the time of her last visit was on Yaz.  Interestingly  enough, she stopped the Yaz and, although she still gets some hot flashes,  she is not soaking the bed and it has become much less symptomatic.  She  still wants to be checked  hormonally as much as we can for menopause so I  am going to run an [facility] and [facility]SH level on her.  If she continues the night  sweats I told her if she wanted to we could try her on a small estrogen  supplement instead of a birth control pill, something that wouldn't suppress  her ovulation.  She seems willing to try that so I will call her with her  results.   IMPRESSION:  Menopausal symptoms, i.e. hot flashes, night sweats.           ______________________________  Argentina Donovan, MD     PR/MEDQ  D:  10/31/2005  T:  10/31/2005  Job:  [id number]

## 2010-08-24 NOTE — H&P (Signed)
NAME:  Holly Clarke, Holly Clarke NO.:  [id number]   MEDICAL RECORD NO.:  [id number]          PATIENT TYPE:  OBV   LOCATION:  1845                         FACILITY:  [facility]   PHYSICIAN:  Asencion Partridge, M.D.     DATE OF BIRTH:  September 16, 1965   DATE OF ADMISSION:  11/29/2005  DATE OF DISCHARGE:                                HISTORY & PHYSICAL   CHIEF COMPLAINT:  Chest pain.   HISTORY OF PRESENT ILLNESS:  The patient is a 45 year old African American  female who complains of sudden sharp chest pain that was acute in onset at 6  o'clock this morning.  She says that she has felt diaphoretic since this  morning.  She woke up, drove her husband to work, and then came back home  and decided to rest until 10 a.m. hoping the pain would go away.  She states  the pain was still worsening at the time so she went to Urgent Care.  She  states that the pain is worse with a deep breath and she also has pain in  the back when she does this.  She states that it is also worse when she is  walking and feels like she cannot breathe when she is lying flat.  She  states that she has had pain before but not to this severity and when she  was worked up for cardiac etiology by her cardiologist that her coronary  arteries were fine.  She states that this was about three years ago.  The  pain is better with nitroglycerin, but she had a headache when she was given  the nitro.  She states the chest pain does go up a little into her left and  her right shoulders with tingling into both arms and fingers.  She says the  pain has not completely gone away since this morning and has ranged from a 7  to 10 out of 10 in severity.  It went down to a 7 when she was given the  nitro.  She states that she has not had any surgery recently or been on any  long car rides.  She has had sweats and chills.  She has also had  palpitations and nausea.  She denies any fevers, cough, wheezing, vomiting,  edema, anxiety, or  depression.  The patient has a cardiologist, Dr. Shana Chute,  but has not seen him in three years.  She states she has a history  significant for mitral valve prolapse and gets antibiotics when she has any  kind of medical procedure but has not had any in the past three years.   PAST MEDICAL HISTORY:  1. Gastroesophageal reflux disease.  2. Migraines.  3. Obstructive sleep apnea.  4. Restless leg syndrome.   ALLERGIES:  CODEINE - the patient states she does not like the way she makes  it her feel, but she is not truly allergic to this medicine.   MEDICATIONS:  1. Imitrex p.r.n. migraine.  2. Robaxin 750 mg q.h.s. p.r.n.  3. Valtrex for break outs.  4. The patient was on birth control  until 1-2 months ago.   PAST SURGICAL HISTORY:  Esophageal dilatation in 2004.   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  She denies any alcohol, smoking, or recent drug  use.   FAMILY HISTORY:  The patient denies any asthma, bronchitis, or early heart  disease in family.  She has a maternal grandmother who had a stroke and an  MI in her 16s and a couple of uncles with heart disease in their 26s.  She  denies any other pertinent family history.   REVIEW OF SYSTEMS:  As per HPI.   PHYSICAL EXAMINATION:  VITAL SIGNS:  Temperature 97.8, pulse 66, blood pressure 112/72,  respirations 20, pulse ox 98% on room air.  GENERAL:  The patient is in no acute distress.  HEENT:  Extraocular movements intact.  Pupils equal, round, reactive to  light.  Tympanic membranes clear with good light reflex.  No erythema or  exudate on pharyngeal exam.  CHEST:  Positive moderate chest wall pain along the left sternal border in  the costochondral region but states this does not reproduce the pain she has  been  talking about that has been 10 out of 10.  LUNGS:  Clear to auscultation bilaterally, no increased work of breathing,  no wheezes, rales, or rhonchi.  HEART:  Regular rate and rhythm, no murmurs, gallops, and rubs heard, but it  was noisy in the hallway.  ABDOMEN:  Soft,  nondistended, mildly tender in the epigastric region  without rebound or guarding, bowel sounds positive.  EXTREMITIES:  Warm and well perfused, no cyanosis or clubbing.  Pulses are  2+ DP pulses.  NEUROLOGICAL:  Alert and oriented x3, conversant and appropriate.  She had  normal strength in the bilateral upper extremities and sensation was normal  in the bilateral upper extremities with the exception of the web space  between the thumb and the index finger on the left hand where there is an  IV, she has some decreased sensation compared to the right.  LYMPHS:  There is no cervical lymphadenopathy.   LABORATORY DATA:  White blood cell count 8.2, hemoglobin 15.8, platelets  268.  Sodium 148, potassium 3.8, chloride 106, bicarb 28.1, BUN 8,  creatinine 0.8, glucose 73.  Point of care enzymes negative x1.  D-dimer  negative.  EKG normal sinus rhythm, questionable low voltage, otherwise, no  ST-T changes, no Q waves, no T wave inversions.  Chest x-ray no acute  abnormality, no infiltrate.   ASSESSMENT/PLAN:  45 year old African American female with chest pain likely  due to musculoskeletal or gastroesophageal reflux.  But, given the character  of her pain, cannot rule out MI at this time.  Patient with moderate  tenderness to palpation of her chest pain that increases with deep breath  and also in her back which could be consistent with costochondritis  especially given the placement of the tenderness.  She also states that she  has had reflux before, but the pain she is having is not the same.  The pain  is worse when lying down which could be consistent with reflux pain.  We will also rule out MI with serial markers.   The patient is without any risk  factors, but the story  of her pain with diaphoresis and decreasing with  nitro, could be cardiac etiology.  EKG and markers were negative x1.  The  patient denies any new stressors, depression, or anxiety, but there may be a  component of anxiety, as well.  The patient is at a low risk for DVT and has  a negative D-dimer with good O2 saturations.  We will follow, but PE is  unlikely.  Chest x-ray is without any infiltrate or edema and I do not feel  there is a component of pneumonia or anything like that.  We will get a  fasting lipid panel in the morning with a CBC and a CMP, in case.  We will  get these studies stat and get a baseline of her liver enzymes.  UDS to rule  out cocaine use.  EKG now and in the a.m. to rule out any changes and  cardiac markers q.8h. x3.  We will start Protonix for questionable reflux  etiology.  We will also give the patient a dose of Celebrex for  musculoskeletal pain but not give any NSAIDs at this time due to the  possibility of reflux.  The patient will get morphine and Tylenol for pain  and we will wean her O2 to room air, she is on 2 liters currently.     ______________________________  Norton Blizzard, M.D.    ______________________________  Asencion Partridge, M.D.    SH/MEDQ  D:  11/29/2005  T:  11/29/2005  Job:  [id number]   cc:   Neena Rhymes, M.D.

## 2010-08-24 NOTE — Group Therapy Note (Signed)
NAME:  Holly Clarke, ROUTE NO.:  [id number]   MEDICAL RECORD NO.:  [id number]          PATIENT TYPE:  WOC   LOCATION:  [facility]                   FACILITY:  [facility]   PHYSICIAN:  Tinnie Gens, MD        DATE OF BIRTH:  05-13-1965   DATE OF SERVICE:  07/30/2006                                  CLINIC NOTE   CHIEF COMPLAINT:  Results.   HISTORY OF PRESENT ILLNESS:  The patient a 45 year old gravida 3, para 1-  0-2-1, who has a history of fibroid uterus, who was seen last week for  abnormal bleeding, abdominal pain, nausea, vomiting and dehydration.  She underwent endometrial sampling, as well as pelvic sonography, which  revealed pretty much unchanged with multiple fibroids the largest of  which was 4.8 x 3.65 x 4.5.  Her endometrial biopsy revealed  proliferative benign endometrium.   PAST MEDICAL HISTORY:  Significant for mitral valve prolapse.   PAST SURGICAL HISTORY:  BTL.   MEDICATIONS:  Requip one p.o. q.h.s. and Imitrex as needed.   ALLERGIES:  None known.Marland Kitchen   FAMILY HISTORY:  Diabetes, coronary artery disease, hypertension.   SOCIAL HISTORY:  No tobacco, no alcohol, no current drug use.   PHYSICAL EXAM TODAY:  Her vitals are as noted in the chart, specifically  blood pressure of 139/90.  She is a well-developed, well-nourished  female in no acute distress.  LUNGS:  Clear bilaterally.  CV:  Regular  rate and rhythm without rubs, gallops, murmurs.  ABDOMEN:  Soft,  nontender, nondistended.  GU:  Normal external female genitalia; BUS is  normal.  Vagina is pink and rugated.  Cervix is posterior and parous.  There is a scar on the cervix.  However, the patient does deny prior  LEEP.  The uterus is enlarged, firm.  It is 12-14 weeks' size and adnexa  were not well-appreciated.   IMPRESSION:  1. Fibroid uterus.  2. Abnormal bleeding.  3. Significant abdominal pain.   PLAN:  TVH to be scheduled.           ______________________________  Tinnie Gens,  MD     TP/MEDQ  D:  07/30/2006  T:  07/30/2006  Job:  [id number]

## 2010-08-24 NOTE — Group Therapy Note (Signed)
NAME:  GENOA, FREYRE NO.:  [id number]   MEDICAL RECORD NO.:  [id number]          PATIENT TYPE:  WOC   [facility]ATION[facility]                   FACILITY:  [facility]   PHYSICIAN:  Argentina Donovan, MD        DATE OF BIRTH:  09-13-1965   DATE OF SERVICE:  01/22/2005                                    CLINIC NOTE   HISTORY:  The patient is a 45 year old gravida 1, para 1-0-0-1 who is 4 feet  11 inches and weighs 172 pounds has a blood pressure of 122/83.  Her only  complaints are heavy bleeding but it has been markedly control and is a  nonsmoker with Lo/Ovral which she takes regularly with no side effects.  The  uterus had an ultrasound and biopsy 2 years ago was negative and the  ultrasound showed small fibroid about 5 cm in diameter posterior to the  uterus.   EXAMINATION:  External genitalia is normal.  BUS within normal limits.  Vagina is clean and well rugated.  Cervix is clean, nulliparous division and  the uterus approximately [redacted] weeks gestational size, freely moveable,  irregular in configuration with adnexa not palpable.  Breast exam showed  symmetrical breasts with no dominant masses.   ASSESSMENT AND PLAN:  Patient is going to get a regular mammogram and her  prescription for Lo-Ovral.  Pap smear was done.           ______________________________  Argentina Donovan, MD     PR/MEDQ  D:  01/22/2005  T:  01/22/2005  Job:  [id number]

## 2010-08-24 NOTE — Group Therapy Note (Signed)
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  It seemed to  be fairly well controlled with oral contraceptives.  She is on Yasmin.  We  are going to switch her to YAZ because of the longer period she had last  cycle.  She also has had a painful nodule that has come up on her labia each  cycle during her period, and gone away right afterwards.  It is red.  It is  sore.  It does not sound like endometriosis, and from what she is  describing, it sounds much more like herpes simplex virus so we are going to  check for that as I think if that is what it is we can do suppression, and  stop her from having that each month.   IMPRESSION:  1.  Menorrhagia and dysmenorrhea, well controlled with oral contraceptives.  2.  Labial nodule.      PR/MEDQ  D:  08/07/2004  T:  08/07/2004  Job:  [id number]

## 2010-08-24 NOTE — Group Therapy Note (Signed)
NAME:  Holly Clarke, JAHN NO.:  [id number]   MEDICAL RECORD NO.:  [id number]          PATIENT TYPE:  WOC   LOCATION:  [facility]                   FACILITY:  [facility]   PHYSICIAN:  Tinnie Gens, MD        DATE OF BIRTH:  [birth year]   DATE OF SERVICE:  07/18/2006                                  CLINIC NOTE   CHIEF COMPLAINT:  Abdominal pain bleeding, vomiting.   HISTORY OF PRESENT ILLNESS:  The patient is a 45 year old gravida 3,  para 1-0-2-1 who has a history of fibroid uterus, who has been placed on  Yaz in the past.  She has restarted this since her last visit and her  bleeding cramping has not gotten any better.  In fact, last month it was  really bad.  She been bleeding for approximately 6 days, got light  headed had nausea, vomiting, dehydration severe cramping and abdomen.  The patient went to see a family practice center.  She had Phenergan and  Vicodin given to her.  She continues to have pain.  The patient reports  a history of fibroid uterus.  She has not had a pelvic scan since she  has been here.  Her last Pap was December 2007 and was within normal  limits.   EXAM:  Today her blood pressure 134/88, weight is 168.8, pulse is 66.  She is a well-developed, well-nourished female in no acute distress.  Her abdomen is soft, nontender, nondistended.  She has a healed incision  at the umbilicus.  GU:  Normal external female genitalia.  Vagina is pink and rugous.  Cervix is posterior.  It looks to be a scar, but the patient denies  prior LEEP procedure in the past.   PROCEDURE:  The cervix is cleaned with Betadine.  The anterior lip of  the cervix is grasped with a single-tooth tenaculum.  Attempts to sound  the uterus.  However, this could not be passed the Pipelle could be  inserted into the uterus and sounded to approximately 6 cm.  A sample  was taken.  I am not sure if this was actual endometrial or just  endocervical.  The patient did not tolerate the  procedure well at all  and it had to be aborted midway through.  To complete the pelvic, the  uterus is enlarged and firm.  Feels about 12-14 weeks size.  Adnexa  cannot be well appreciated.   IMPRESSION:  1. History of fibroid uterus.  2. Menorrhagia and pelvic pain, abdominal pain.   PLAN:  Schedule pelvic ultrasound and endometrial sampling today.  Follow up 2 weeks to discuss treatment options.  The patient is planning  for either [facility] or radiologic ablation.           ______________________________  Tinnie Gens, MD     TP/MEDQ  D:  07/18/2006  T:  07/18/2006  Job:  [id number]

## 2010-08-24 NOTE — Op Note (Signed)
Holly Clarke, Holly Clarke               ACCOUNT NO.:  [id number]   MEDICAL RECORD NO.:  [id number]          PATIENT TYPE:  AMB   LOCATION:  [facility]                           [facility]   PHYSICIAN:  Tanya S. Shawnie Pons, M.D.   DATE OF BIRTH:  [birth year]   DATE OF PROCEDURE:  09/03/2006  DATE OF DISCHARGE:                               OPERATIVE REPORT   PREOPERATIVE DIAGNOSIS:  Fibroid uterus, menorrhagia.   POSTOPERATIVE DIAGNOSIS:  Fibroid uterus, menorrhagia.   PROCEDURE:  Transvaginal hysterectomy.   SURGEON:  Tinnie Gens, M.D.   ASSISTANT:  Ginger Carne, M.D.   ANESTHESIA:  General and local with Dr. Arby Barrette.   SPECIMENS:  Uterus and cervix to pathology.   ESTIMATED BLOOD LOSS:  200 mL.   COMPLICATIONS:  None known.   FINDINGS:  289 grams uterus with multiple fibroids.   REASON FOR PROCEDURE:  Briefly the patient is a 45 year old gravida 3,  para 1-0-2-1 who had fibroid uterus that was causing her abnormal  bleeding.  She desired permanent treatment of this with hysterectomy.   PROCEDURE:  The patient was taken to the OR, she was placed in dorsal  lithotomy in [place] stirrups.  She was prepped draped in usual sterile  fashion.  A weighted speculum placed inside the vagina.  The cervix  grasped with two double tooth tenaculum and then injected with 20 mL of  1% lidocaine with epinephrine.  Circumferential incision was made about  the cervix into the vagina and blunt dissection was used to dissect the  vagina off the cervix.  The anterior portion of the vagina was made to  portion of the cervix.  Sharp dissection was used until the anterior  peritoneal cavity was entered.  The posteriorly the peritoneal cavity  was also entered sharply and the peritoneum tacked to the posterior  vagina.  A long weighted speculum was then placed inside this opening  and a Heaney clamp was used to clamp the uterosacrals bilaterally.  These were cut and suture ligated with a Heaney suture  and tagged.  The  gyrus was then used to get the uterine arteries bilaterally and several  more clamps were advanced up the broad ligament with excellent  hemostasis noted.  At this point the uterus could not be flipped so  coring was done until the entire uterus was delivered.  The tubo-ovarian  pedicles were doubly clamped, cut and the specimen removed from the  cavity.  A free tie was placed around the back clamp followed by a stick  tie at the front clamp and the tubo-ovarian pedicles were inspected felt  to be hemostatic.  There is some oozing from the cuff anteriorly and  laterally, one free tie was placed over a Heaney clamp in the area of  the uterosacral ligament.  Electrocautery was also used to stop  bleeding.  The cuff was closed in a horizontal locked running fashion  with excellent hemostasis noted incorporating the posterior peritoneum  into the cuff closure and the uterosacral ligaments.  All instruments  were  removed from the vagina.  Foley was  placed.  Clear yellow urine was  noted.  The vagina packed with 2-inch packing. All instrument and lap  counts were correct x2.  The patient awakened and taken to recovery in  stable condition.           ______________________________  Shelbie Proctor Shawnie Pons, M.D.     TSP/MEDQ  D:  09/03/2006  T:  09/03/2006  Job:  [id number]

## 2010-08-24 NOTE — Group Therapy Note (Signed)
NAME:  Holly Clarke, Holly Clarke               ACCOUNT NO.:  [id number]   MEDICAL RECORD NO.:  [id number]          PATIENT TYPE:  WOC   LOCATION:  [facility]                   FACILITY:  [facility]   PHYSICIAN:  Argentina Donovan, MD        DATE OF BIRTH:  May 02, 1965   DATE OF SERVICE:  09/05/2005                                    CLINIC NOTE   The patient is a 45 year old, gravida 1, para 1-0-0-1, 4 feet 11 inches, 142  pounds with normal blood pressure comes in with complaints of chest  tenderness, extreme hot flashes, and night sweats that soak her bed,  progressive weight gain although the weight has not changed since she was  last seen bloating and premenstrual pain.  The patient has been on Yaz in  order to try and control her premenstrual pain without significant help.  She is convinced that she is perimenopausal. We are encouraging her to stop  her Yaz and then come back in four weeks and we will evaluate that.  In the  meantime, her weight is stable.  She does have costochondritis on both sides  of the sternum where she is quite tender and we have encouraged her to try  __________  cream.  She has a uterus which has not changed in size and  somewhat irregular from some small fibroids, the largest being 4 cm.  However, she does have a positive whiff test and we will treat her with  Flagyl.   IMPRESSION:  BV, night sweats, premenstrual pelvic pain.   PLAN:  We are going to obtain CMET, a TSH, and a chest PA and lateral.           ______________________________  Argentina Donovan, MD     PR/MEDQ  D:  09/05/2005  T:  09/06/2005  Job:  [id number]

## 2010-08-24 NOTE — Discharge Summary (Signed)
Holly Clarke, Holly Clarke               ACCOUNT NO.:  [id number]   MEDICAL RECORD NO.:  [id number]          PATIENT TYPE:  OBV   LOCATION:  4734                         FACILITY:  [facility]   PHYSICIAN:  Rolm Gala, M.D.    DATE OF BIRTH:  04/26/1965   DATE OF ADMISSION:  11/29/2005  DATE OF DISCHARGE:  11/30/2005                                 DISCHARGE SUMMARY   DISCHARGE DIAGNOSES:  1. Chest pain, likely musculoskeletal.  2. Gastroesophageal reflux disease.  3. Migraines.  4. Obstructive sleep apnea.  5. Restless leg syndrome.   DISCHARGE MEDICATIONS:  1. Imitrex p.r.n. migraine.  2. Robaxin 750 mg q.h.s. p.r.n.  3. Valtrex 500 mg p.o. daily p.r.n.  4. Requip 1 mg at nighttime has taken previously.  5. Tylenol and ibuprofen for pain.   PROCEDURE:  None.   CONSULT:  None.   HISTORY OF PRESENT ILLNESS:  A 45 year old, African American female, who  complains of a sudden sharp chest pain that brought her to the ER on 8/24.   PROBLEM:  1. Chest pain.  Patient is a low cardiac risk with a tobacco history of 15      years ago and an extended family member, an uncle, with a cardiac      history. She already sees a cardiologist and has an appointment with      him Monday.  Patient was ruled out with negative cardiac enzymes times      three.  She had a lipid profile, which showed a cholesterol of 155, and      HDL of 47 and an LDL of 88.  A urine drug screen was positive for      barbiturates.  Patient's D-Dymer was less than 0.22.  It was thought      that the pain was likely musculoskeletal, since it was reproduced with      palpation, and patient was sent home to follow up with her regular      cardiologist.   1. GERD.  Patient was on Protonix here.  She does not currently take      anything at home.   1. Restless leg syndrome.  She is continuing on her Requip.   FOLLOWUP:  Patient will see her usual cardiologist on Monday and Dr. Beverely Low  in one to two weeks.   LABORATORY  DATA:  Cardiac enzymes negative times three.  A CBC and a CMP  within normal limits.  A D-dimer was negative and a urine drug screen was  positive for barbiturates.  A lipid profile showed a cholesterol of 155,  triglycerides 98, HDL 47 and LDL 88.      Rolm Gala, M.D.     Bennetta Laos  D:  11/30/2005  T:  11/30/2005  Job:  [id number]

## 2010-08-24 NOTE — Group Therapy Note (Signed)
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  Over the past couple of years, has had a problem with  dyspareunia, premenstrual pain starting 2-3 weeks before her period with  breast fullness, tenderness, and complained of fibroid tumors.  Ultrasound  done at family practice revealed 2 small fibroids, the largest being 4.0 cm  in diameter.  She also says that her periods have gone from 3 to 7 days with  severe dysmenorrhea.  We have discussed the possibility of surgery with the  patient, but before that, we thought we might try her on birth control pills  which she has never been tried on.  We are starting her on a prescription of  Yasmin for 2 months, and we will recheck her then, and talk to her to see if  this is taking care of her symptoms.  If not, we will schedule her for TAH  and bilateral SO.   IMPRESSION:  1.  Uterine fibroids.  2.  Chronic pelvic pain with increasingly heavy periods.      PR/MEDQ  D:  05/29/2004  T:  05/29/2004  Job:  [id number]

## 2010-08-24 NOTE — Op Note (Signed)
[facility]  Patient:    Holly Clarke, Holly Clarke Visit Number: [id number] MRN: [id number]          Service Type: NES Location: [facility] Attending Physician:  Lindaann Slough Dictated by:   Lindaann Slough, M.D. Proc. Date: 05/29/01 Admit Date:  05/29/2001                             Operative Report  PREOPERATIVE DIAGNOSIS:  Stress incontinence.  POSTOPERATIVE DIAGNOSIS:  Stress incontinence.  PROCEDURE:  Cystoscopy and collagen injection.  SURGEON:  Lindaann Slough, M.D.  ANESTHESIA:  General.  INDICATIONS FOR PROCEDURE:  The patient is a 45 year old female who has been complaining of frequency, urgency, and stress incontinence. He was treated with antibiotics without any improvement of her symptoms. Physical examination showed a moderate cystocele. Treatment options were discussed with the patient; collagen injection versus bladder neck suspension. She would like to have collagen injection. She had negative collagen allergy test. She is scheduled today for the procedure.  The risks and benefits of the procedure were discussed with the patient.  DESCRIPTION OF PROCEDURE:  Under general anesthesia, the patient was prepped and draped and placed in the dorsal lithotomy position. A #21 Wappler cystoscope was inserted in the bladder. The bladder mucosa is normal. There is no stone or tumor in the bladder. The ureteral orifices are in normal position and shape with clear efflux. There was evidence of submucosal hemorrhage after dilation of the bladder. The bladder has a capacity of about 600 cc.  Then the ureteral meatus was infiltrated with 1% lidocaine and 5 cc of collagen were injected in the periurethral tissues in the submucosa about a 1/2 cm from the bladder neck at the 4 oclock position. The same procedure was also done on the right side at the 8 oclock position. There is good coaptation of the urethral mucosa.  The bladder was then emptied and the  cystoscope removed.  The patient tolerated the procedure well and left the OR in satisfactory condition to post anesthesia care unit. Dictated by:   Lindaann Slough, M.D. Attending Physician:  Lindaann Slough DD:  05/29/01 TD:  05/29/01 Job: [id number]

## 2011-01-23 LAB — URINALYSIS, ROUTINE W REFLEX MICROSCOPIC
Nitrite: NEGATIVE
Specific Gravity, Urine: >1.03 — ABNORMAL HIGH
Urobilinogen, UA: 0.2

## 2011-01-23 LAB — URINE MICROSCOPIC-ADD ON

## 2016-03-13 DIAGNOSIS — S93601A Unspecified sprain of right foot, initial encounter: Secondary | ICD-10-CM | POA: Insufficient documentation

## 2016-03-15 ENCOUNTER — Ambulatory Visit: Payer: Self-pay | Admitting: Rehabilitative and Restorative Service Providers"

## 2016-03-18 ENCOUNTER — Ambulatory Visit (INDEPENDENT_AMBULATORY_CARE_PROVIDER_SITE_OTHER): Payer: Worker's Compensation | Admitting: Rehabilitative and Restorative Service Providers"

## 2016-03-18 ENCOUNTER — Encounter: Payer: Self-pay | Admitting: Rehabilitative and Restorative Service Providers"

## 2016-03-18 DIAGNOSIS — R2689 Other abnormalities of gait and mobility: Secondary | ICD-10-CM

## 2016-03-18 DIAGNOSIS — M25571 Pain in right ankle and joints of right foot: Secondary | ICD-10-CM | POA: Diagnosis not present

## 2016-03-18 DIAGNOSIS — M6281 Muscle weakness (generalized): Secondary | ICD-10-CM | POA: Diagnosis not present

## 2016-03-18 DIAGNOSIS — M25551 Pain in right hip: Secondary | ICD-10-CM

## 2016-03-18 NOTE — Patient Instructions (Addendum)
HIP: Hamstrings - Supine   Place strap around foot. Raise leg up, keeping knee straight.  Bend opposite knee to protect back if indicated. Hold 30 seconds. 3 reps per set, 2-3 sets per day  Knee to Chest    Lying supine, bend involved knee to chest _5__ times.holod 10-20 sec  Repeat with other leg. Do _3-4__ times per day.    Ankle Pump    With left leg elevated, gently flex and extend ankle. Move through full range of motion.  Repeat __10-20 times per set. Do _1-2___ sets per session. Do __3-4__ sessions per day.    ROM: Inversion / Eversion    With left leg relaxed, gently turn ankle and foot in and out. Move through full range of motion.  Repeat __10-20__ times per set. Do _1-2___ sets per session. Do _3-4___ sessions per day.    Ankle Alphabet    Using left ankle and foot only, trace the letters of the alphabet. Perform A to Z. Repeat __1-2__ times per set. Do _1-2___ sets per session. Do __3-4__ sessions per day.  Standing with feet apart - shift weight side to side Standing with one foot forward, one back shift weight front to back   Balance: Unilateral    Attempt to balance on left leg, eyes open. Hold __10-20__ seconds. Repeat __3-5__ times per set. Do __1-2__ sets per session. Do __3-4__ sessions per day. Can also perform exercise with eyes closed.

## 2016-03-18 NOTE — Therapy (Signed)
[facility] [address] [place], [place], [address] Phone: [phone]   Fax:  [phone]  Physical Therapy Evaluation  Patient Details  Name: Holly Clarke MRN: [id number] Date of Birth: 12/29/1965 Referring Provider: Dr Magdalene RiverMichael Lucas  Encounter Date: 03/18/2016      PT End of Session - 03/18/16 1645    Visit Number 1   Number of Visits 12   Date for PT Re-Evaluation 04/29/16   PT Start Time 1418   PT Stop Time 1522   PT Time Calculation (min) 64 min   Activity Tolerance Patient tolerated treatment well      History reviewed. No pertinent past medical history.  History reviewed. No pertinent surgical history.  There were no vitals filed for this visit.       Subjective Assessment - 03/18/16 1422    Subjective Patient reports that she fell 02/12/16 falling onto the Rt side sustaining sprain of Rt ankle and injury to Rt hip. She was seen in urgent care and she has been taking antiinflammatory and has been in an ASO and walking boot since the time of the accident. She continues to have severe pain in the ankle and is unable to get into the shoes.    Pertinent History denies any medical or musculoskeletal problems    How long can you sit comfortably? 30 min    How long can you stand comfortably? 10 min    How long can you walk comfortably? 5 min in walking boot    Diagnostic tests xrays - no fractures    Patient Stated Goals Get stronger in in hip and ankle - get rid of the pain and back to work   Currently in Pain? Yes   Pain Score 10-Worst pain ever   Pain Location Ankle   Pain Orientation Right   Pain Descriptors / Indicators Throbbing;Numbness;Tingling   Pain Type Acute pain   Pain Radiating Towards back of leg    Pain Onset More than a month ago   Pain Frequency Constant   Aggravating Factors  standing; bending; trying to exercise; driving without boot - pressing foot down    Pain Relieving Factors  elevation; ice   Multiple Pain Sites Yes   Pain Score 10  10/10   Pain Location Hip   Pain Orientation Right   Pain Descriptors / Indicators Aching   Pain Type Acute pain   Pain Radiating Towards across to the mid back on Rt to Lt    Pain Onset More than a month ago   Pain Frequency Intermittent   Aggravating Factors  sitting; standing; bending; in and out of the tub   Pain Relieving Factors heat; changing positions; rest            [facility] - 03/18/16 0001      Assessment   Medical Diagnosis Rt ankle and hip sprains   Referring Provider Dr Magdalene RiverMichael Lucas   Onset Date/Surgical Date 02/12/16   Hand Dominance Right   Next MD Visit 05/07/16   Prior Therapy none     Precautions   Precautions None     Balance Screen   Has the patient fallen in the past 6 months Yes   How many times? 1   Has the patient had a decrease in activity level because of a fear of falling?  Yes   Is the patient reluctant to leave their home because of a fear of falling?  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 Works PT at [employer] 6 hr/day/4 days/wk    Leisure Household chores      Observation/Other Assessments   Focus on Therapeutic Outcomes (FOTO)  72% limitation     Sensation   Additional Comments intermittent numbness and tingling in Rt foot when foot is dependent      Posture/Postural Control   Posture Comments wt shifted to the Lt with Rt LE in ER in standing      AROM   Right Hip Extension 0   Right Hip Flexion 100   Right Hip ABduction --  WNL's   Right Hip ADduction --  WNL's   Left Hip Extension 10   Left Hip Flexion 120   Left Hip ABduction --  WFL's   Left Hip ADduction --  WFL's   Right  Ankle Dorsiflexion -5   Right Ankle Plantar Flexion 35   Right Ankle Inversion 21   Right Ankle Eversion 8   Left Ankle Dorsiflexion 7   Left Ankle Plantar Flexion 64   Left Ankle Inversion 32   Left Ankle Eversion 12     Strength   Right Hip Extension --  5-/5   Right Hip ABduction --  5-/5   Right Hip ADduction 5/5   Left Hip Flexion 5/5   Left Hip Extension 5/5   Left Hip ABduction 5/5   Left Hip ADduction 5/5   Right Ankle Dorsiflexion 4-/5   Right Ankle Plantar Flexion 3-/5   Right Ankle Inversion 3-/5   Right Ankle Eversion 3-/5   Left Ankle Dorsiflexion 5/5   Left Ankle Plantar Flexion 5/5   Left Ankle Inversion 5/5   Left Ankle Eversion 5/5     Flexibility   Hamstrings Rt 80 deg; Lt 90 deg   ITB WNL's   Piriformis WNL's      Palpation   Spinal mobility WNL's   Palpation comment tenderness to palpation through Rt lumbar area into the Rt hip/buttocks posteriorly. Rt ankle - tender Rt ATF ligament area, posterior to lateral malleolus      Special Tests    Special Tests --  SLR; Fabers (-)      Ambulation/Gait   Gait Comments ambulate with walking shoe - Rt foot/hip in ER; limp on Rt LE with wt bearing on Rt                    [facility] Adult PT Treatment/Exercise - 03/18/16 0001      Lumbar Exercises: Stretches   Passive Hamstring Stretch 3 reps;30 seconds   Passive Hamstring Stretch Limitations with strap - incoporating ankle DF to stretch calf    Single Knee to Chest Stretch 3 reps;10 seconds  Rt    Lower Trunk Rotation 2 reps;20 seconds  knees to left     Moist Heat Therapy   Number Minutes Moist Heat 15 Minutes   Moist Heat Location Lumbar Spine;Hip  Rt     Cryotherapy   Number Minutes Cryotherapy 10 Minutes  trial of vaso - pt ask to discontinue d/t weight    Cryotherapy Location Ankle  Rt   Type of Cryotherapy Ice pack     Electrical  Stimulation   Electrical Stimulation Location Rt lumbar to hip area    Electrical Stimulation  Action IFC   Electrical Stimulation Parameters to tolerance   Electrical Stimulation Goals Pain;Tone     Iontophoresis   Type of Iontophoresis Dexamethasone   Location Rt lateral ankle    Dose 80 mAmp   Time 8 hours      Ankle Exercises: Standing   SLS home instruction 20-30 sec x 5    Other Standing Ankle Exercises wt shift with feet apart; stagger stance 20-30 each     Ankle Exercises: Seated   ABC's 1 rep   Ankle Circles/Pumps Right;20 reps                PT Education - 03/18/16 1531    Education provided Yes   Education Details HEP ionto    Person(s) Educated Patient   Methods Explanation;Demonstration;Tactile cues;Verbal cues;Handout   Comprehension Verbalized understanding;Returned demonstration;Verbal cues required;Tactile cues required             PT Long Term Goals - 03/18/16 1658      PT LONG TERM GOAL #1   Title Rt ankle and hip ROM equal to Lt 04/29/16   Time 6   Period Weeks   Status New     PT LONG TERM GOAL #2   Title 5-/5 to 5/5 Rt ankle and Rt LE 04/29/16   Time 6   Period Weeks   Status New     PT LONG TERM GOAL #3   Title normalized gait pattern without significant edema at the end of her work day 04/29/16   Time 6   Period Weeks   Status New     PT LONG TERM GOAL #4   Title Independent in HEP 04/29/16   Time 6   Period Weeks   Status New     PT LONG TERM GOAL #5   Title Improve FOTO to </= 51% limitation 04/29/16   Time 6   Period Weeks   Status New               Plan - 03/18/16 1646    Clinical Impression Statement Ms. Sem presents with Rt ankle sprain and Lt low back/Lt hip pain following a fall 02/12/16. She continues in a Rt ankle ASO and walking boot. Patient ambulates with Rt LE in ER with decreased wt bearing Rt LE during stance phase of ambulation and abnormal gait pattern. Patient has decreased ROM and strength Rt ankle and hip. She has tenderness and pain with palpation in Rt ankle and Rt lumbar/buttocks  area. She has limited functional activity level related to limitations noted.     Rehab Potential Good   PT Frequency 2x / week   PT Duration 6 weeks   PT Treatment/Interventions Patient/family education;ADLs/Self Care Home Management;Cryotherapy;Electrical Stimulation;Iontophoresis 4mg /ml Dexamethasone;Moist Heat;Ultrasound;Dry needling;Manual techniques;Therapeutic activities;Therapeutic exercise   PT Next Visit Plan progress with lumbar stretching and core stabilization; ankle ROM and strengthening; gait and balance activities; assess response to ionto Rt lat ankle; manual and modalities as indicated (did not like vaso)   Consulted and Agree with Plan of Care Patient      Patient will benefit from skilled therapeutic intervention in order to improve the following deficits and impairments:  Postural dysfunction, Improper body mechanics, Pain, Decreased range of motion, Decreased mobility, Decreased strength, Abnormal gait, Decreased activity tolerance  Visit Diagnosis: Pain in right ankle and joints of right foot - Plan: PT plan of care cert/re-cert  Pain in right hip - Plan: PT plan of care cert/re-cert  Muscle weakness (generalized) - Plan: PT plan of care cert/re-cert  Other abnormalities of gait and mobility - Plan: PT plan of care cert/re-cert     Problem List Patient Active Problem List   Diagnosis Date Noted  . GENITAL HERPES 03/26/2007  . BLURRED VISION 01/28/2007  . VAGINITIS 12/04/2006  . FATIGUE 12/04/2006  . DISORDER, DEPRESSIVE NEC 10/21/2006  . ALLERGIC  RHINITIS 08/07/2006  . BACK PAIN, CHRONIC 08/07/2006  . ABDOMINAL PAIN, LOWER 07/16/2006  . MENORRHAGIA 07/15/2006  . SYMPTOM, PAIN, ABDOMINAL, LEFT LOWER QUADRANT 07/15/2006  . OBESITY, NOS 06/05/2006  . RESTLESS LEGS SYNDROME 06/05/2006  . MIGRAINE, UNSPEC., W/O INTRACTABLE MIGRAINE 06/05/2006  . GASTROESOPHAGEAL REFLUX, NO ESOPHAGITIS 06/05/2006  . MENSTRUATION, PAINFUL 06/05/2006  . APNEA, SLEEP  06/05/2006    Sani Loiseau Rober MinionP Maurisha Mongeau PT, MPH  03/18/2016, 5:05 PM  [facility] [address] [place], [place], [address] Phone: [phone]   Fax:  [phone]  Name: Holly Clarke MRN: [id number] Date of Birth: 12/23/1965

## 2016-03-21 ENCOUNTER — Ambulatory Visit (INDEPENDENT_AMBULATORY_CARE_PROVIDER_SITE_OTHER): Payer: Worker's Compensation | Admitting: Physical Therapy

## 2016-03-21 DIAGNOSIS — M25571 Pain in right ankle and joints of right foot: Secondary | ICD-10-CM

## 2016-03-21 DIAGNOSIS — R2689 Other abnormalities of gait and mobility: Secondary | ICD-10-CM

## 2016-03-21 DIAGNOSIS — M6281 Muscle weakness (generalized): Secondary | ICD-10-CM | POA: Diagnosis not present

## 2016-03-21 DIAGNOSIS — M25551 Pain in right hip: Secondary | ICD-10-CM

## 2016-03-21 NOTE — Therapy (Signed)
[facility] [address] [place], [place], [address] Phone: [phone]   Fax:  [phone]  Physical Therapy Treatment  Patient Details  Name: Holly CarneyKecia D Clarke MRN: [id number] Date of Birth: 10/28/1965 Referring Provider: Dr Magdalene RiverMichael Lucas  Encounter Date: 03/21/2016      PT End of Session - 03/21/16 1103    Visit Number 2   Number of Visits 12   Date for PT Re-Evaluation 04/29/16   PT Start Time 1103   PT Stop Time 1156   PT Time Calculation (min) 53 min   Activity Tolerance Patient limited by pain  in her ankle and Lateral hip, had to take exercise slowly.       No past medical history on file.  No past surgical history on file.  There were no vitals filed for this visit.      Subjective Assessment - 03/21/16 1103    Subjective Pt reports she is still having the same issues.    Patient Stated Goals Get stronger in in hip and ankle - get rid of the pain and back to work   Currently in Pain? Yes   Pain Score 10-Worst pain ever   Pain Location Ankle  and hip   Pain Orientation Right                         [facility] Adult PT Treatment/Exercise - 03/21/16 0001      Self-Care   Self-Care Other Self-Care Comments   Other Self-Care Comments  check alignment of LE's d/t nature of injury and SIJ tenderness, she is in alignment.      Lumbar Exercises: Stretches   ITB Stretch 2 reps;30 seconds  in s/l     Lumbar Exercises: Aerobic   Stationary Bike no resistance  for ROM x 5'      Modalities   Modalities Cryotherapy;Electrical Stimulation     Cryotherapy   Number Minutes Cryotherapy 15 Minutes   Cryotherapy Location Ankle;Lumbar Spine   Type of Cryotherapy Ice pack     Electrical Stimulation   Electrical Stimulation Location Rt ankle, Rt SIJ   Electrical Stimulation Action ion repelling   Electrical Stimulation Parameters to tolerance   Electrical Stimulation Goals Pain;Tone     Iontophoresis   Type of Iontophoresis Dexamethasone   Location Rt lateral malleoli and Rt SIJ   Dose 80mAmp   Time 8 hrs     Manual Therapy   Manual Therapy Joint mobilization   Manual therapy comments grade II Rt SIJ mobs, Rt ankle mobs DF and eversion     Ankle Exercises: Seated   BAPS Level 2;Sitting  clock 12/6, 3/9, CW/CCW   Other Seated Ankle Exercises 10 reps hip abduction, 10 clam shells Rt                      PT Long Term Goals - 03/18/16 1658      PT LONG TERM GOAL #1   Title Rt ankle and hip ROM equal to Lt 04/29/16   Time 6   Period Weeks   Status New     PT LONG TERM GOAL #2   Title 5-/5 to 5/5 Rt ankle and Rt LE 04/29/16   Time 6   Period Weeks   Status New     PT LONG TERM GOAL #3   Title normalized gait pattern without significant edema at the end of her work day 04/29/16  Time 6   Period Weeks   Status New     PT LONG TERM GOAL #4   Title Independent in HEP 04/29/16   Time 6   Period Weeks   Status New     PT LONG TERM GOAL #5   Title Improve FOTO to </= 51% limitation 04/29/16   Time 6   Period Weeks   Status New               Plan - 03/21/16 1309    Clinical Impression Statement This is [name]'s second visit, she appeared anxious about moving her ankle however once she started therapy she did well and tolerated motion with some increase in pain as would be expected being splinted for this long. She is also very tender in the Rt SIJ, had some relief with gentle mobs here. Expect there is some edema in the SIJ   Rehab Potential Good   PT Frequency 2x / week   PT Duration 6 weeks   PT Treatment/Interventions Patient/family education;ADLs/Self Care Home Management;Cryotherapy;Electrical Stimulation;Iontophoresis 4mg /ml Dexamethasone;Moist Heat;Ultrasound;Dry needling;Manual techniques;Therapeutic activities;Therapeutic exercise   PT Next Visit Plan Pt is a " germaphobe, her words" and requests that things be wiped down or  have a barrier . assess response to ionto to SIJ and ankle. progress with lumbar stretching and core stabilization; ankle ROM and strengthening; gait and balance activities; assess response to ionto Rt lat ankle; manual and modalities as indicated (did not like vaso)   Consulted and Agree with Plan of Care Patient      Patient will benefit from skilled therapeutic intervention in order to improve the following deficits and impairments:  Postural dysfunction, Improper body mechanics, Pain, Decreased range of motion, Decreased mobility, Decreased strength, Abnormal gait, Decreased activity tolerance  Visit Diagnosis: Pain in right ankle and joints of right foot  Pain in right hip  Muscle weakness (generalized)  Other abnormalities of gait and mobility     Problem List Patient Active Problem List   Diagnosis Date Noted  . GENITAL HERPES 03/26/2007  . BLURRED VISION 01/28/2007  . VAGINITIS 12/04/2006  . FATIGUE 12/04/2006  . DISORDER, DEPRESSIVE NEC 10/21/2006  . ALLERGIC  RHINITIS 08/07/2006  . BACK PAIN, CHRONIC 08/07/2006  . ABDOMINAL PAIN, LOWER 07/16/2006  . MENORRHAGIA 07/15/2006  . SYMPTOM, PAIN, ABDOMINAL, LEFT LOWER QUADRANT 07/15/2006  . OBESITY, NOS 06/05/2006  . RESTLESS LEGS SYNDROME 06/05/2006  . MIGRAINE, UNSPEC., W/O INTRACTABLE MIGRAINE 06/05/2006  . GASTROESOPHAGEAL REFLUX, NO ESOPHAGITIS 06/05/2006  . MENSTRUATION, PAINFUL 06/05/2006  . APNEA, SLEEP 06/05/2006    Holly ScarceSusan Shaver PT 03/21/2016, 1:14 PM  [facility] [address] [place], [place], [address] Phone: [phone]   Fax:  [phone]  Name: Holly CarneyKecia D Kielbasa MRN: [id number] Date of Birth: 02/04/1966

## 2016-03-26 ENCOUNTER — Ambulatory Visit (INDEPENDENT_AMBULATORY_CARE_PROVIDER_SITE_OTHER): Payer: Worker's Compensation | Admitting: Rehabilitative and Restorative Service Providers"

## 2016-03-26 ENCOUNTER — Encounter: Payer: Self-pay | Admitting: Rehabilitative and Restorative Service Providers"

## 2016-03-26 DIAGNOSIS — M6281 Muscle weakness (generalized): Secondary | ICD-10-CM | POA: Diagnosis not present

## 2016-03-26 DIAGNOSIS — M25571 Pain in right ankle and joints of right foot: Secondary | ICD-10-CM | POA: Diagnosis not present

## 2016-03-26 DIAGNOSIS — R2689 Other abnormalities of gait and mobility: Secondary | ICD-10-CM

## 2016-03-26 DIAGNOSIS — M25551 Pain in right hip: Secondary | ICD-10-CM | POA: Diagnosis not present

## 2016-03-26 NOTE — Patient Instructions (Addendum)
Trigger Point Dry Needling  . What is Trigger Point Dry Needling (DN)? o DN is a physical therapy technique used to treat muscle pain and dysfunction. Specifically, DN helps deactivate muscle trigger points (muscle knots).  o A thin filiform needle is used to penetrate the skin and stimulate the underlying trigger point. The goal is for a local twitch response (LTR) to occur and for the trigger point to relax. No medication of any kind is injected during the procedure.   . What Does Trigger Point Dry Needling Feel Like?  o The procedure feels different for each individual patient. Some patients report that they do not actually feel the needle enter the skin and overall the process is not painful. Very mild bleeding may occur. However, many patients feel a deep cramping in the muscle in which the needle was inserted. This is the local twitch response.   Marland Kitchen. How Will I feel after the treatment? o Soreness is normal, and the onset of soreness may not occur for a few hours. Typically this soreness does not last longer than two days.  o Bruising is uncommon, however; ice can be used to decrease any possible bruising.  o In rare cases feeling tired or nauseous after the treatment is normal. In addition, your symptoms may get worse before they get better, this period will typically not last longer than 24 hours.   . What Can I do After My Treatment? o Increase your hydration by drinking more water for the next 24 hours. o You may place ice or heat on the areas treated that have become sore, however, do not use heat on inflamed or bruised areas. Heat often brings more relief post needling. o You can continue your regular activities, but vigorous activity is not recommended initially after the treatment for 24 hours. o DN is best combined with other physical therapy such as strengthening, stretching, and other therapies.    4 way ankle TB yellow  10 x 2   Achilles / Gastroc, Standing    Stand, right  foot behind, heel on floor and turned slightly out, leg straight, forward leg bent. Move hips forward. Hold __30_ seconds. Repeat _3__ times per session. Do _2-3 sessions per day.

## 2016-03-26 NOTE — Therapy (Signed)
[facility] [place] [address]  [address] [place], [place], [address] Phone: [phone]   Fax:  [phone]  Physical Therapy Treatment  Patient Details  Name: Holly Clarke MRN: [id number] Date of Birth: December 30, 1965 Referring Provider: Dr Magdalene River  Encounter Date: 03/26/2016      PT End of Session - 03/26/16 0858    Visit Number 3   Number of Visits 12   Date for PT Re-Evaluation 04/29/16   PT Start Time 0845   PT Stop Time 0943   PT Time Calculation (min) 58 min   Activity Tolerance Patient limited by pain      History reviewed. No pertinent past medical history.  History reviewed. No pertinent surgical history.  There were no vitals filed for this visit.      Subjective Assessment - 03/26/16 0858    Subjective Patient reports that she returned to work last night. She had her Rt foot was down all night as she was sitting at her job. Ankle and hip are worse this am. She has increased tingling; numbness and swelling inthe Rt ankle tiis am.    Currently in Pain? Yes   Pain Score 10-Worst pain ever   Pain Location Ankle   Pain Orientation Right   Pain Descriptors / Indicators Throbbing;Tingling;Numbness   Pain Onset More than a month ago   Pain Frequency Constant   Pain Score 10  10/10   Pain Location Hip   Pain Orientation Right   Pain Descriptors / Indicators Aching   Pain Type Acute pain                         [facility] Adult PT Treatment/Exercise - 03/26/16 0001      Lumbar Exercises: Stretches   ITB Stretch 2 reps;30 seconds  in s/l     Lumbar Exercises: Aerobic   Stationary Bike Nu step L4 slow pace for ROM/mvt 8 min      Moist Heat Therapy   Number Minutes Moist Heat 15 Minutes   Moist Heat Location Lumbar Spine;Hip  Rt     Cryotherapy   Number Minutes Cryotherapy 15 Minutes   Cryotherapy Location Ankle   Type of Cryotherapy Ice pack     Electrical Stimulation   Electrical  Stimulation Location Rt hip    Electrical Stimulation Action IFC   Electrical Stimulation Parameters to tolerance   Electrical Stimulation Goals Pain;Tone     Manual Therapy   Manual Therapy Joint mobilization   Manual therapy comments grade II Rt SIJ mobs, Rt ankle mobs DF and eversion   Soft tissue mobilization STW Rt hip through the hip abductors      Ankle Exercises: Stretches   Gastroc Stretch 3 reps;30 seconds     Ankle Exercises: Seated   Other Seated Ankle Exercises 4 way TB yellow TB 10 x 2 sets      Ankle Exercises: Standing   Other Standing Ankle Exercises wt shift with feet apart; stagger stance 20-30 each   Other Standing Ankle Exercises wt bearing Rt UE                PT Education - 03/26/16 0934    Education provided Yes   Education Details HEP; TDN   Person(s) Educated Patient   Methods Explanation;Demonstration;Tactile cues;Verbal cues;Handout   Comprehension Verbalized understanding;Returned demonstration;Verbal cues required;Tactile cues required             PT Long Term Goals -  03/26/16 1024      PT LONG TERM GOAL #1   Title Rt ankle and hip ROM equal to Lt 04/29/16   Time 6   Period Weeks   Status On-going     PT LONG TERM GOAL #2   Title 5-/5 to 5/5 Rt ankle and Rt LE 04/29/16   Time 6   Period Weeks   Status On-going     PT LONG TERM GOAL #3   Title normalized gait pattern without significant edema at the end of her work day 04/29/16   Time 6   Period Weeks   Status On-going     PT LONG TERM GOAL #4   Title Independent in HEP 04/29/16   Time 6   Period Weeks   Status On-going     PT LONG TERM GOAL #5   Title Improve FOTO to </= 51% limitation 04/29/16   Time 6   Period Weeks   Status On-going               Plan - 03/26/16 1015    Clinical Impression Statement Ms Laural Benesjohnson returned to work last night - primarily sitting but reports that her Rt foot did not touch the floor. Offered suggestions for elevation of the Rt  LE at times during her shift. Patient reports that her hip pain is worse and she plans to request that the MD do an xray on the hip. Patient returns to part time job at [employer] Wednesday. Patient has difficulty tolerating exercises and manual work in clinic due to report of pain.  She does not want to continue trial of ionto - does not feel ionto is helpful. Patient declined written instruction for HEP stating that she would remember the exercises. Asking about trial of TDN for Rt hip.    Rehab Potential Good   PT Frequency 2x / week   PT Duration 6 weeks   PT Treatment/Interventions Patient/family education;ADLs/Self Care Home Management;Cryotherapy;Electrical Stimulation;Iontophoresis 4mg /ml Dexamethasone;Moist Heat;Ultrasound;Dry needling;Manual techniques;Therapeutic activities;Therapeutic exercise   PT Next Visit Plan progress with lumbar stretching and core stabilization; ankle ROM and strengthening; gait and balance activities; ionto Rt lat ankle as indicated; manual and modalities as indicated (did not like vaso); consider trial of TDN at patient's request - provided information on TDN to patient.    Consulted and Agree with Plan of Care Patient      Patient will benefit from skilled therapeutic intervention in order to improve the following deficits and impairments:  Postural dysfunction, Improper body mechanics, Pain, Decreased range of motion, Decreased mobility, Decreased strength, Abnormal gait, Decreased activity tolerance  Visit Diagnosis: Pain in right ankle and joints of right foot  Pain in right hip  Muscle weakness (generalized)  Other abnormalities of gait and mobility     Problem List Patient Active Problem List   Diagnosis Date Noted  . GENITAL HERPES 03/26/2007  . BLURRED VISION 01/28/2007  . VAGINITIS 12/04/2006  . FATIGUE 12/04/2006  . DISORDER, DEPRESSIVE NEC 10/21/2006  . ALLERGIC  RHINITIS 08/07/2006  . BACK PAIN, CHRONIC 08/07/2006  .  ABDOMINAL PAIN, LOWER 07/16/2006  . MENORRHAGIA 07/15/2006  . SYMPTOM, PAIN, ABDOMINAL, LEFT LOWER QUADRANT 07/15/2006  . OBESITY, NOS 06/05/2006  . RESTLESS LEGS SYNDROME 06/05/2006  . MIGRAINE, UNSPEC., W/O INTRACTABLE MIGRAINE 06/05/2006  . GASTROESOPHAGEAL REFLUX, NO ESOPHAGITIS 06/05/2006  . MENSTRUATION, PAINFUL 06/05/2006  . APNEA, SLEEP 06/05/2006    Celyn Rober MinionP Holt PT, MPH  03/26/2016, 10:25 AM  [facility] [address] [place] [address] [place], [place], [address] Phone: [phone]   Fax:  [phone]  Name: Lupe CarneyKecia D Neas MRN: [id number] Date of Birth: 10/15/1965

## 2016-03-29 ENCOUNTER — Encounter: Payer: Self-pay | Admitting: Physical Therapy

## 2016-04-09 ENCOUNTER — Ambulatory Visit (INDEPENDENT_AMBULATORY_CARE_PROVIDER_SITE_OTHER): Payer: Worker's Compensation | Admitting: Physical Therapy

## 2016-04-09 ENCOUNTER — Encounter: Payer: Self-pay | Admitting: Physical Therapy

## 2016-04-09 DIAGNOSIS — M6281 Muscle weakness (generalized): Secondary | ICD-10-CM

## 2016-04-09 DIAGNOSIS — R2689 Other abnormalities of gait and mobility: Secondary | ICD-10-CM | POA: Diagnosis not present

## 2016-04-09 DIAGNOSIS — M25571 Pain in right ankle and joints of right foot: Secondary | ICD-10-CM | POA: Diagnosis not present

## 2016-04-09 DIAGNOSIS — M25551 Pain in right hip: Secondary | ICD-10-CM | POA: Diagnosis not present

## 2016-04-09 NOTE — Therapy (Signed)
[place], [place], [address] Phone: [phone]   Fax:  [phone]  Physical Therapy Treatment  Patient Details  Name: Holly Clarke MRN: [id number] Date of Birth: [birth year] Referring Provider: Dr Deneen Harts  Encounter Date: 04/09/2016      PT End of Session - 04/09/16 1149    Visit Number 4   Number of Visits 12   Date for PT Re-Evaluation 04/29/16   Authorization - Number of Visits 8   PT Start Time 1150   PT Stop Time 0488   PT Time Calculation (min) 63 min      History reviewed. No pertinent past medical history.  History reviewed. No pertinent surgical history.  There were no vitals filed for this visit.      Subjective Assessment - 04/09/16 1150    Subjective Pt sees her MD tomorrow, she hasn't been back to work because the pain is so bad and extreme swelling   Currently in Pain? Yes   Pain Score 10-Worst pain ever  8/10 with elevation of the foot   Pain Location Ankle   Pain Orientation Right   Pain Descriptors / Indicators Throbbing   Pain Type Acute pain   Pain Onset More than a month ago   Pain Frequency Constant   Aggravating Factors  any weightbearing and motion   Pain Relieving Factors elevation   Pain Score 7   Pain Location Hip   Pain Orientation Right   Pain Descriptors / Indicators Aching   Pain Type Acute pain   Pain Onset More than a month ago   Pain Frequency Constant   Aggravating Factors  sitting has gotten worse , sleeping - can't lay on the Rt side.    Pain Relieving Factors heat.             [facility] PT Assessment - 04/09/16 0001      Assessment   Medical Diagnosis Rt ankle and hip sprains   Referring Provider Dr Deneen Harts   Onset Date/Surgical Date 02/12/16   Hand Dominance Right     Sensation   Additional Comments intermittent numbness and tingling in Rt foot when foot is dependent      AROM   Right Hip Extension 0   Right Hip  Flexion 100   Left Hip Extension 10   Right Ankle Dorsiflexion -2  3 passively                      [facility] Adult PT Treatment/Exercise - 04/09/16 0001      Exercises   Exercises Ankle     Lumbar Exercises: Stretches   Single Knee to Chest Stretch 2 reps;30 seconds   ITB Stretch --  with lower trunk rotation     Lumbar Exercises: Aerobic   Stationary Bike for ROM x5'     Modalities   Modalities Moist Heat;Cryotherapy;Electrical Stimulation     Moist Heat Therapy   Number Minutes Moist Heat 15 Minutes   Moist Heat Location Lumbar Spine;Hip     Cryotherapy   Number Minutes Cryotherapy 15 Minutes   Cryotherapy Location Ankle   Type of Cryotherapy Ice pack     Electrical Stimulation   Electrical Stimulation Location Rt hip    Electrical Stimulation Action IFC   Electrical Stimulation Parameters to tolerance   Electrical Stimulation Goals Pain;Tone     Manual Therapy   Manual Therapy Soft tissue mobilization   Soft tissue mobilization  Rt gluts/ QL      Ankle Exercises: Stretches   Gastroc Stretch 2 reps;30 seconds     Ankle Exercises: Standing   Other Standing Ankle Exercises AAROM Rt ankle eversion    Other Standing Ankle Exercises toe flex/extension          Trigger Point Dry Needling - 04/09/16 1219    Consent Given? Yes   Education Handout Provided Yes   Muscles Treated Upper Body Quadratus Lumborum  Rt    Muscles Treated Lower Body Gluteus maximus   Gluteus Maximus Response Palpable increased muscle length;Twitch response elicited  Rt                   PT Long Term Goals - 04/09/16 1251      PT LONG TERM GOAL #1   Title Rt ankle and hip ROM equal to Lt 04/29/16   Status On-going     PT LONG TERM GOAL #2   Title 5-/5 to 5/5 Rt ankle and Rt LE 04/29/16   Status On-going     PT LONG TERM GOAL #3   Title normalized gait pattern without significant edema at the end of her work day 04/29/16   Status On-going     PT LONG TERM  GOAL #4   Title Independent in HEP 04/29/16   Status On-going     PT LONG TERM GOAL #5   Title Improve FOTO to </= 51% limitation 04/29/16   Status On-going               Plan - 04/09/16 1248    Clinical Impression Statement Holly Clarke continues to have a lot of pain in her Rt ankle, this is leading to gait abnormalities. These along with the fall are leading to tightness and pain around the Rt hip and low back.  The edema in her Rt ankle is reducing however still present. No goals have been met at this time.  Progress is very slow, she had a set back when she attempted to return to work.  Performed a trial to TDN today to the Rt hip.    Rehab Potential Good   PT Frequency 2x / week   PT Duration 6 weeks   PT Treatment/Interventions Patient/family education;ADLs/Self Care Home Management;Cryotherapy;Electrical Stimulation;Iontophoresis 80m/ml Dexamethasone;Moist Heat;Ultrasound;Dry needling;Manual techniques;Therapeutic activities;Therapeutic exercise   PT Next Visit Plan progress with lumbar stretching and core stabilization; ankle ROM and strengthening; gait and balance activities; ionto Rt lat ankle as indicated; manual and modalities as indicated (did not like vaso); Will see what MD recommends at the visit tomorrow.    Consulted and Agree with Plan of Care Patient      Patient will benefit from skilled therapeutic intervention in order to improve the following deficits and impairments:  Postural dysfunction, Improper body mechanics, Pain, Decreased range of motion, Decreased mobility, Decreased strength, Abnormal gait, Decreased activity tolerance  Visit Diagnosis: Pain in right ankle and joints of right foot  Pain in right hip  Muscle weakness (generalized)  Other abnormalities of gait and mobility     Problem List Patient Active Problem List   Diagnosis Date Noted  . GENITAL HERPES 03/26/2007  . BLURRED VISION 01/28/2007  . VAGINITIS 12/04/2006  . FATIGUE  12/04/2006  . DISORDER, DEPRESSIVE NEC 10/21/2006  . ALLERGIC  RHINITIS 08/07/2006  . BACK PAIN, CHRONIC 08/07/2006  . ABDOMINAL PAIN, LOWER 07/16/2006  . MENORRHAGIA 07/15/2006  . SYMPTOM, PAIN, ABDOMINAL, LEFT LOWER QUADRANT 07/15/2006  . OBESITY, NOS 06/05/2006  .  RESTLESS LEGS SYNDROME 06/05/2006  . MIGRAINE, UNSPEC., W/O INTRACTABLE MIGRAINE 06/05/2006  . GASTROESOPHAGEAL REFLUX, NO ESOPHAGITIS 06/05/2006  . MENSTRUATION, PAINFUL 06/05/2006  . APNEA, SLEEP 06/05/2006    Jeral Pinch PT  04/09/2016, 1:01 PM  [facility] [place], [place], [address] Phone: [phone]   Fax:  [phone]  Name: Holly Clarke MRN: [id number] Date of Birth: 06-18-1965

## 2016-04-16 ENCOUNTER — Ambulatory Visit (INDEPENDENT_AMBULATORY_CARE_PROVIDER_SITE_OTHER): Payer: Worker's Compensation | Admitting: Physical Therapy

## 2016-04-16 DIAGNOSIS — M25551 Pain in right hip: Secondary | ICD-10-CM | POA: Diagnosis not present

## 2016-04-16 DIAGNOSIS — M25571 Pain in right ankle and joints of right foot: Secondary | ICD-10-CM | POA: Diagnosis not present

## 2016-04-16 DIAGNOSIS — M6281 Muscle weakness (generalized): Secondary | ICD-10-CM | POA: Diagnosis not present

## 2016-04-16 DIAGNOSIS — R2689 Other abnormalities of gait and mobility: Secondary | ICD-10-CM | POA: Diagnosis not present

## 2016-04-16 NOTE — Therapy (Signed)
[facility] [place] [address] [place] [address] [place], [place], [address] Phone: [phone]   Fax:  [phone]  Physical Therapy Treatment  Patient Details  Name: Holly Clarke MRN: [id number] Date of Birth: 1966/01/19 Referring Provider: Dr Samuel Bouche  Encounter Date: 04/16/2016      PT End of Session - 04/16/16 1256    Visit Number 5   Number of Visits 12   Date for PT Re-Evaluation 04/29/16   Authorization - Number of Visits 8   PT Start Time 1149   PT Stop Time 1252   PT Time Calculation (min) 63 min   Activity Tolerance Patient limited by pain      No past medical history on file.  No past surgical history on file.  There were no vitals filed for this visit.      Subjective Assessment - 04/16/16 1152    Subjective Pt said the doctor PA didn't say anything, didn't write her out of work.  " you can call them to see what they said, I don't want to talk about it"  I hurt every where.  I'm not getting better.  Didn't like the needling had a bee stinging sensation for 2 days. ( later in treatment patient did report that the PA didn't "touch" her and didn't put her out of work, she just changed her medication to mobic and she is waiting for workers comp to fill it )    Currently in Pain? Yes  explained to patient that 10/10 is tears and needing to go to the hospital, she replied I need to go to the hospital however it costs me to much to go.    Pain Score 10-Worst pain ever   Pain Location Ankle   Pain Orientation Right   Pain Descriptors / Indicators Sharp   Pain Type Acute pain   Pain Onset More than a month ago   Pain Frequency Constant   Aggravating Factors  has been staying off the ankle.    Pain Relieving Factors elevation, alteranting ice /heat    Effect of Pain on Daily Activities unable to work or do her daily activites.    Pain Score 10   Pain Location Hip   Pain Orientation Right   Pain Descriptors / Indicators Stabbing   Pain Type Acute pain   Pain Onset More than a month ago   Pain Frequency Constant   Aggravating Factors  lying on the right side, more stiffness with sitting   Pain Relieving Factors taking pressure off the hip in sitting, heat and ice,            [facility] PT Assessment - 04/16/16 0001      Assessment   Medical Diagnosis Rt ankle and hip sprains   Referring Provider Dr Samuel Bouche   Onset Date/Surgical Date 02/12/16   Hand Dominance Right   Next MD Visit 05/07/16     Precautions   Precautions None     Balance Screen   Has the patient fallen in the past 6 months --  no falls since starting therapy     Prior Function   Vocation Requirements pt reports she is unable to work at this time due to pain     Observation/Other Assessments-Edema    Edema --  slight edema present lateral Lt ankle, much decreased      Sensation   Additional Comments intermittent numbness and tingling in Rt foot when foot is dependent      Functional  Tests   Functional tests Single leg stance     Single Leg Stance   Comments Lt 31 sec, Rt 8 sec, unable to hold      AROM   Right Ankle Dorsiflexion -12  passively-9, patient requested to stop due to pain     Strength   Right Ankle Dorsiflexion 4/5   Right Ankle Plantar Flexion 3+/5   Right Ankle Inversion 4-/5   Right Ankle Eversion 4/5     Palpation   Palpation comment good mobility of Lt talus posteriorly and calcaneal rock.                      [facility]/Exercise - 04/16/16 0001      Ambulation/Gait   Ambulation/Gait Yes   Ambulation/Gait Assistance 7: Independent   Ambulation Distance (Feet) --  observed patient walking thru the parking lot and in the cli   Assistive device None   Gait Pattern Slight antalgic;slight decreased stance on right     Lumbar Exercises: Aerobic   Stationary Bike for ROM x 5' able to perform without restrictions,  VC to keep her knees in, she said she can only do what she can because  she hurts all over.      Modalities   Modalities Cryotherapy;Moist Heat     Moist Heat Therapy   Number Minutes Moist Heat 15 Minutes   Moist Heat Location Lumbar Spine;Hip     Cryotherapy   Number Minutes Cryotherapy 15 Minutes   Cryotherapy Location Ankle  Rt   Type of Cryotherapy Ice pack     Manual Therapy   Manual Therapy Soft tissue mobilization   Soft tissue mobilization STW and TPR to Rt QL and upper gluts, there is a trigger point present,  it decreased slightly with manual work. still very tender to patient.      Ankle Exercises: Stretches   Soleus Stretch 1 rep;30 seconds   Gastroc Stretch 1 rep;30 seconds     Ankle Exercises: Standing   Other Standing Ankle Exercises on blue foam discs wt shift side/side, then staggered and shift FWD/BWD      Ankle Exercises: Seated   Other Seated Ankle Exercises DF in avail range with red band x10 reps, pt reports her toes went numb with this, all of them   Other Seated Ankle Exercises 10 reps eversion with red band                     PT Long Term Goals - 04/16/16 1253      PT LONG TERM GOAL #1   Title Rt ankle and hip ROM equal to Lt 04/29/16   Status On-going     PT LONG TERM GOAL #2   Title 5-/5 to 5/5 Rt ankle and Rt LE 04/29/16   Status On-going     PT LONG TERM GOAL #3   Title normalized gait pattern without significant edema at the end of her work day 04/29/16   Status On-going     PT LONG TERM GOAL #4   Title Independent in HEP 04/29/16   Status On-going     PT LONG TERM GOAL #5   Title Improve FOTO to </= 51% limitation 04/29/16   Status On-going               Plan - 04/16/16 1257    Clinical Impression Statement this is Holly Clarke's 5th visit, she continues to report 10/10 pain even  when it is explained that means tears and needing to go to the hospital.  She has inconsistencies in her Rt ankle motion, last week she had improvement, this week her motion is worse than on her initial  evaluation however her ankle strength was improved, she was able to take more resistance with the break test.  She does have trigger points in the Rt gluts and quadratus lumborum. This is most likely due to the fact that she is elevating her foot and has her trunk rotated to keep the pressure off the hip.  Her gait on observation into the clinic has improved.  Less deviations present.    Rehab Potential Good   PT Frequency 2x / week   PT Duration 6 weeks   PT Treatment/Interventions Patient/family education;ADLs/Self Care Home Management;Cryotherapy;Electrical Stimulation;Iontophoresis 4mg /ml Dexamethasone;Moist Heat;Ultrasound;Dry needling;Manual techniques;Therapeutic activities;Therapeutic exercise   PT Next Visit Plan progress proprioception in the ankle and LE. manual work and possible modalites to Rt hip/gluts. She does not want DN again.    Consulted and Agree with Plan of Care Patient      Patient will benefit from skilled therapeutic intervention in order to improve the following deficits and impairments:  Postural dysfunction, Improper body mechanics, Pain, Decreased range of motion, Decreased mobility, Decreased strength, Abnormal gait, Decreased activity tolerance  Visit Diagnosis: Pain in right ankle and joints of right foot  Pain in right hip  Muscle weakness (generalized)  Other abnormalities of gait and mobility     Problem List Patient Active Problem List   Diagnosis Date Noted  . GENITAL HERPES 03/26/2007  . BLURRED VISION 01/28/2007  . VAGINITIS 12/04/2006  . FATIGUE 12/04/2006  . DISORDER, DEPRESSIVE NEC 10/21/2006  . ALLERGIC  RHINITIS 08/07/2006  . BACK PAIN, CHRONIC 08/07/2006  . ABDOMINAL PAIN, LOWER 07/16/2006  . MENORRHAGIA 07/15/2006  . SYMPTOM, PAIN, ABDOMINAL, LEFT LOWER QUADRANT 07/15/2006  . OBESITY, NOS 06/05/2006  . RESTLESS LEGS SYNDROME 06/05/2006  . MIGRAINE, UNSPEC., W/O INTRACTABLE MIGRAINE 06/05/2006  . GASTROESOPHAGEAL REFLUX, NO  ESOPHAGITIS 06/05/2006  . MENSTRUATION, PAINFUL 06/05/2006  . APNEA, SLEEP 06/05/2006    Roderic Scarce PT  04/16/2016, 1:04 PM  [facility] [address]  [address] [place], [place], [address] Phone: [phone]   Fax:  [phone]  Name: Holly Clarke MRN: [id number] Date of Birth: Mar 20, 1966

## 2016-04-23 ENCOUNTER — Ambulatory Visit (INDEPENDENT_AMBULATORY_CARE_PROVIDER_SITE_OTHER): Payer: Worker's Compensation | Admitting: Rehabilitative and Restorative Service Providers"

## 2016-04-23 ENCOUNTER — Encounter: Payer: Self-pay | Admitting: Rehabilitative and Restorative Service Providers"

## 2016-04-23 DIAGNOSIS — R2689 Other abnormalities of gait and mobility: Secondary | ICD-10-CM

## 2016-04-23 DIAGNOSIS — M6281 Muscle weakness (generalized): Secondary | ICD-10-CM

## 2016-04-23 DIAGNOSIS — M25571 Pain in right ankle and joints of right foot: Secondary | ICD-10-CM | POA: Diagnosis not present

## 2016-04-23 DIAGNOSIS — M25551 Pain in right hip: Secondary | ICD-10-CM | POA: Diagnosis not present

## 2016-04-23 NOTE — Therapy (Signed)
[facility] [address] [place], [place], [address] Phone: [phone]   Fax:  [phone]  Physical Therapy Treatment  Patient Details  Name: Holly CarneyKecia D Wolpert MRN: [id number] Date of Birth: 05/23/1965 Referring Provider: Dr Magdalene RiverMichael Lucas  Encounter Date: 04/23/2016      PT End of Session - 04/23/16 1149    Visit Number 6   Number of Visits 12   Date for PT Re-Evaluation 04/29/16   PT Start Time 1149   PT Stop Time 1248   PT Time Calculation (min) 59 min   Activity Tolerance Patient limited by pain      History reviewed. No pertinent past medical history.  History reviewed. No pertinent surgical history.  There were no vitals filed for this visit.      Subjective Assessment - 04/23/16 1156    Subjective Patient reports that she is continuing to have pain in the Rt ankle and hip. She feels she may be getting a "little better" but she wouyld think she would be better than she is. 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    Currently in Pain? Yes   Pain Score 8    Pain Location Ankle   Pain Orientation Right   Pain Descriptors / Indicators Sharp   Pain Type Acute pain   Pain Onset More than a month ago   Pain Frequency Constant   Multiple Pain Sites Yes   Pain Location Hip   Pain Orientation Right   Pain Descriptors / Indicators Throbbing;Aching  stiff with getting up - has to stop and rest when walking    Pain Type Acute pain   Pain Onset More than a month ago   Pain Frequency Constant            [facility] PT Assessment - 04/23/16 0001      Assessment   Medical Diagnosis Rt ankle and hip sprains   Referring Provider Dr Magdalene RiverMichael Lucas   Onset Date/Surgical Date 02/12/16   Hand Dominance Right   Next MD Visit 05/07/16     AROM   Right Ankle Dorsiflexion 0  passive ROM 9 deg                      [facility] Adult PT Treatment/Exercise - 04/23/16 0001      Lumbar Exercises:  Stretches   Single Knee to Chest Stretch 2 reps;30 seconds   Single Knee to Chest Stretch Limitations diagonal knee to chest 20 sec x 3    Lower Trunk Rotation 2 reps;20 seconds     Lumbar Exercises: Aerobic   Stationary Bike nustep L5 x 5 min      Lumbar Exercises: Sidelying   Hip Abduction 10 reps  Lt sidelying - Rt LE raise      Moist Heat Therapy   Number Minutes Moist Heat 15 Minutes   Moist Heat Location Lumbar Spine;Hip  Rt     Cryotherapy   Number Minutes Cryotherapy 15 Minutes   Cryotherapy Location Ankle  Rt   Type of Cryotherapy Ice pack     Manual Therapy   Manual therapy comments Rt ankle mobs/ROM DF and eversion     Ankle Exercises: Stretches   Soleus Stretch 3 reps;60 seconds   Gastroc Stretch 3 reps;60 seconds     Ankle Exercises: Seated   BAPS Level 2  fwd/back; inv/ever; circles CW   Other Seated Ankle Exercises DF/PF/inver/ever red TB  10 reps each    Other Seated Ankle Exercises ankle PF/DF; inver/ever foot on foam roll      Ankle Exercises: Standing   Heel Raises 20 reps  partial range    Other Standing Ankle Exercises on blue foam discs wt shift side/side, then staggered and shift FWD/BWD    Other Standing Ankle Exercises walking on mini tramp 30 sec x 2                      PT Long Term Goals - 04/16/16 1253      PT LONG TERM GOAL #1   Title Rt ankle and hip ROM equal to Lt 04/29/16   Status On-going     PT LONG TERM GOAL #2   Title 5-/5 to 5/5 Rt ankle and Rt LE 04/29/16   Status On-going     PT LONG TERM GOAL #3   Title normalized gait pattern without significant edema at the end of her work day 04/29/16   Status On-going     PT LONG TERM GOAL #4   Title Independent in HEP 04/29/16   Status On-going     PT LONG TERM GOAL #5   Title Improve FOTO to </= 51% limitation 04/29/16   Status On-going               Plan - 04/23/16 1246    Clinical Impression Statement Continued c/o pain in Rt hip and ankle. Patient has  minimal edema lateral ankle; continued tenderness to palpation; increased APOM Rt ankle; less antalgic gait noted in clinic today. Gradually progressing toward stated goals of therapy.    Rehab Potential Good   PT Frequency 2x / week   PT Duration 6 weeks   PT Treatment/Interventions Patient/family education;ADLs/Self Care Home Management;Cryotherapy;Electrical Stimulation;Iontophoresis 4mg /ml Dexamethasone;Moist Heat;Ultrasound;Dry needling;Manual techniques;Therapeutic activities;Therapeutic exercise   PT Next Visit Plan progress proprioception in the ankle and LE. manual work and possible modalites to Rt hip/gluts. She does not want DN again.    Consulted and Agree with Plan of Care Patient      Patient will benefit from skilled therapeutic intervention in order to improve the following deficits and impairments:  Postural dysfunction, Improper body mechanics, Pain, Decreased range of motion, Decreased mobility, Decreased strength, Abnormal gait, Decreased activity tolerance  Visit Diagnosis: Pain in right ankle and joints of right foot  Pain in right hip  Muscle weakness (generalized)  Other abnormalities of gait and mobility     Problem List Patient Active Problem List   Diagnosis Date Noted  . GENITAL HERPES 03/26/2007  . BLURRED VISION 01/28/2007  . VAGINITIS 12/04/2006  . FATIGUE 12/04/2006  . DISORDER, DEPRESSIVE NEC 10/21/2006  . ALLERGIC  RHINITIS 08/07/2006  . BACK PAIN, CHRONIC 08/07/2006  . ABDOMINAL PAIN, LOWER 07/16/2006  . MENORRHAGIA 07/15/2006  . SYMPTOM, PAIN, ABDOMINAL, LEFT LOWER QUADRANT 07/15/2006  . OBESITY, NOS 06/05/2006  . RESTLESS LEGS SYNDROME 06/05/2006  . MIGRAINE, UNSPEC., W/O INTRACTABLE MIGRAINE 06/05/2006  . GASTROESOPHAGEAL REFLUX, NO ESOPHAGITIS 06/05/2006  . MENSTRUATION, PAINFUL 06/05/2006  . APNEA, SLEEP 06/05/2006    Ronnel Zuercher Rober Minion  PT, MPH  04/23/2016, 12:54 PM  [facility] [address]  [address] [place], [place], [address] Phone: [phone]   Fax:  [phone]  Name: NEELA ZECCA MRN: [id number] Date of Birth: 1965/06/02

## 2016-04-29 ENCOUNTER — Encounter: Payer: Self-pay | Admitting: Rehabilitative and Restorative Service Providers"

## 2016-04-29 ENCOUNTER — Ambulatory Visit (INDEPENDENT_AMBULATORY_CARE_PROVIDER_SITE_OTHER): Payer: Worker's Compensation | Admitting: Rehabilitative and Restorative Service Providers"

## 2016-04-29 DIAGNOSIS — R2689 Other abnormalities of gait and mobility: Secondary | ICD-10-CM | POA: Diagnosis not present

## 2016-04-29 DIAGNOSIS — M6281 Muscle weakness (generalized): Secondary | ICD-10-CM | POA: Diagnosis not present

## 2016-04-29 DIAGNOSIS — M25571 Pain in right ankle and joints of right foot: Secondary | ICD-10-CM | POA: Diagnosis not present

## 2016-04-29 DIAGNOSIS — M25551 Pain in right hip: Secondary | ICD-10-CM

## 2016-04-29 NOTE — Therapy (Addendum)
[facility] [place] [address] [place], [place], [address] Phone: [phone]   Fax:  [phone]  Physical Therapy Treatment  Patient Details  Name: Holly Clarke MRN: [id number] Date of Birth: 1965/06/20 Referring Provider: Dr Deneen Harts  Encounter Date: 04/29/2016      PT End of Session - 04/29/16 1405    Visit Number 7   Number of Visits 12   Date for PT Re-Evaluation 04/29/16   Authorization - Number of Visits 8   PT Start Time 1402   PT Stop Time 1975   PT Time Calculation (min) 40 min   Activity Tolerance Patient tolerated treatment well;Patient limited by pain      History reviewed. No pertinent past medical history.  History reviewed. No pertinent surgical history.  There were no vitals filed for this visit.      Subjective Assessment - 04/29/16 1407    Subjective Patient reports tht there is no change in the ankle. "It feels just the same." She sees MD in the morning for her hip but does not have a return appt for her ankle. She does not have any medication to relieve the pain. Difficult to rest and sleep.    Currently in Pain? Yes   Pain Score 8    Pain Location Ankle   Pain Orientation Right   Pain Descriptors / Indicators Aching;Nagging   Pain Type Acute pain   Pain Onset More than a month ago   Pain Frequency Constant   Aggravating Factors  on her feet/ankle    Pain Relieving Factors elevatioin; ice; heat    Pain Score 9   Pain Location Hip   Pain Orientation Right   Pain Descriptors / Indicators Sharp;Nagging;Throbbing;Tingling   Pain Type Acute pain   Pain Onset More than a month ago   Pain Frequency Constant   Aggravating Factors  lying down or standing for prolonged times   Pain Relieving Factors taking pressure off hip in sitting; heat; ice             [facility] PT Assessment - 04/29/16 0001      Assessment   Medical Diagnosis Rt ankle and hip sprains   Referring Provider Dr  Deneen Harts   Onset Date/Surgical Date 02/12/16   Hand Dominance Right   Next MD Visit 05/07/16     AROM   Right Ankle Dorsiflexion 3     Strength   Right Ankle Dorsiflexion --  5-/5   Right Ankle Plantar Flexion 4/5   Right Ankle Inversion --  5-/5   Right Ankle Eversion 4+/5     Palpation   Palpation comment good mobility of Lt talus posteriorly and calcaneal rock.; tenderness to palpation lateral Rt foot/ankle at ATF ligament area     Ambulation/Gait   Gait Comments good improvement in gait pattern with less toe out on Rt and improved wt shift to Rt with wt bearing Rt LE.                      [place] Adult PT Treatment/Exercise - 04/29/16 0001      Lumbar Exercises: Aerobic   Stationary Bike nustep L5 x 5 min      Manual Therapy   Manual therapy comments Rt ankle mobs/ROM DF and eversion     Ankle Exercises: Stretches   Soleus Stretch 3 reps;60 seconds   Gastroc Stretch 3 reps;60 seconds     Ankle Exercises: Seated   BAPS  Level 2  fwd/back; inv/ever; circles CW   Other Seated Ankle Exercises DF/PF/inver/ever red TB 10 reps each    Other Seated Ankle Exercises ankle PF/DF; inver/ever foot on foam roll      Ankle Exercises: Standing   Heel Raises 20 reps  partial range    Other Standing Ankle Exercises on blue discs wt shift side/side, then staggered and shift FWD/BWD; step ups forward x 10 2 sets; lateral step ups 10 x 2 sets; functional squats x 10    Other Standing Ankle Exercises walking on mini tramp 30 sec x 2                 PT Education - 04/29/16 1721    Education provided Yes   Education Details HEP dicussed modification for theraband exercises to offer resistance for ankle DF; issued red and green TB for progression of exercises    Person(s) Educated Patient   Methods Explanation   Comprehension Verbalized understanding             PT Long Term Goals - 04/29/16 1727      PT LONG TERM GOAL #1   Title Rt ankle and hip ROM  equal to Lt 04/29/16   Time 6   Period Weeks   Status Partially Met     PT LONG TERM GOAL #2   Title 5-/5 to 5/5 Rt ankle and Rt LE 04/29/16   Time 6   Period Weeks   Status Partially Met     PT LONG TERM GOAL #3   Title normalized gait pattern without significant edema at the end of her work day 04/29/16   Time 6   Period Weeks   Status Partially Met     PT LONG TERM GOAL #4   Title Independent in HEP 04/29/16   Time 6   Period Weeks   Status On-going     PT LONG TERM GOAL #5   Title Improve FOTO to </= 51% limitation 04/29/16   Time 6   Period Weeks   Status On-going               Plan - 04/29/16 1722    Clinical Impression Statement Patient continues to c/o pain at 8/10 level however she does demonstrate increased ankle ROM and strength; decreased edema and improved gait pattern. She has tenderness to palpation through the lateral ankle at ATF ligament. Patient will be seeing a doctor tomorrow for her hip and would like to wait until after this appointment to schedule further therapy if indicated.    Rehab Potential Good   PT Frequency 2x / week   PT Duration 6 weeks   PT Treatment/Interventions Patient/family education;ADLs/Self Care Home Management;Cryotherapy;Electrical Stimulation;Iontophoresis 12m/ml Dexamethasone;Moist Heat;Ultrasound;Dry needling;Manual techniques;Therapeutic activities;Therapeutic exercise   PT Next Visit Plan Additional appointments as pt schedules. progress proprioception in the ankle and LE. manual work and possible modalites to Rt hip/gluts. She does not want DN again.    Consulted and Agree with Plan of Care Patient      Patient will benefit from skilled therapeutic intervention in order to improve the following deficits and impairments:  Postural dysfunction, Improper body mechanics, Pain, Decreased range of motion, Decreased mobility, Decreased strength, Abnormal gait, Decreased activity tolerance  Visit Diagnosis: Pain in right ankle  and joints of right foot  Pain in right hip  Muscle weakness (generalized)  Other abnormalities of gait and mobility     Problem List Patient Active Problem List  Diagnosis Date Noted  . GENITAL HERPES 03/26/2007  . BLURRED VISION 01/28/2007  . VAGINITIS 12/04/2006  . FATIGUE 12/04/2006  . DISORDER, DEPRESSIVE NEC 10/21/2006  . ALLERGIC  RHINITIS 08/07/2006  . BACK PAIN, CHRONIC 08/07/2006  . ABDOMINAL PAIN, LOWER 07/16/2006  . MENORRHAGIA 07/15/2006  . SYMPTOM, PAIN, ABDOMINAL, LEFT LOWER QUADRANT 07/15/2006  . OBESITY, NOS 06/05/2006  . RESTLESS LEGS SYNDROME 06/05/2006  . MIGRAINE, UNSPEC., W/O INTRACTABLE MIGRAINE 06/05/2006  . GASTROESOPHAGEAL REFLUX, NO ESOPHAGITIS 06/05/2006  . MENSTRUATION, PAINFUL 06/05/2006  . APNEA, SLEEP 06/05/2006    Raveen Wieseler Nilda Simmer PT, MPH  04/29/2016, 5:29 PM  [facility] [place], [place], [address] Phone: [phone]   Fax:  [phone]  Name: DERRICKA MERTZ MRN: [id number] Date of Birth: [birth year]  PHYSICAL THERAPY DISCHARGE SUMMARY  Visits from Start of Care: 7  Current functional level related to goals / functional outcomes: Good gains in ROM and strength noted; improved gait pattern. Continued c/o tenderness and pain with palpation lateral ankle and in the hip.   Remaining deficits: Continued pain per pt report    Education / Equipment: HEP Plan: Patient agrees to discharge.  Patient goals were partially met. Patient is being discharged due to                                                     ?????    Patient was not approved for additional PT visits.  Jaleen Grupp P. Helene Kelp PT, MPH 05/29/16 2:44 PM

## 2016-04-30 ENCOUNTER — Encounter: Payer: Self-pay | Admitting: Rehabilitative and Restorative Service Providers"

## 2016-11-06 DIAGNOSIS — Z87891 Personal history of nicotine dependence: Secondary | ICD-10-CM | POA: Insufficient documentation

## 2017-01-13 DIAGNOSIS — J4540 Moderate persistent asthma, uncomplicated: Secondary | ICD-10-CM | POA: Insufficient documentation

## 2017-10-06 ENCOUNTER — Ambulatory Visit: Payer: BLUE CROSS/BLUE SHIELD | Admitting: Podiatry

## 2017-10-06 ENCOUNTER — Encounter: Payer: Self-pay | Admitting: Podiatry

## 2017-10-06 DIAGNOSIS — N83209 Unspecified ovarian cyst, unspecified side: Secondary | ICD-10-CM | POA: Insufficient documentation

## 2017-10-06 DIAGNOSIS — W450XXA Nail entering through skin, initial encounter: Secondary | ICD-10-CM | POA: Diagnosis not present

## 2017-10-06 DIAGNOSIS — M2010 Hallux valgus (acquired), unspecified foot: Secondary | ICD-10-CM | POA: Diagnosis not present

## 2017-10-06 NOTE — Progress Notes (Signed)
This patient presents the office with chief complaint of having lost her second toenail right foot.  She says that this toenail has fallen off and she says a new nail is already regrowing.  She says she is not having any pain or discomfort at the site of the nail bed in which she lost her toenail.  She says she's had no redness, swelling or drainage at the site.  She presents the office to discuss this nail problem.  General Appearance  Alert, conversant and in no acute stress.  Vascular  Dorsalis pedis and posterior tibial  pulses are palpable  bilaterally.  Capillary return is within normal limits  bilaterally. Temperature is within normal limits  bilaterally.  Neurologic  Senn-Weinstein monofilament wire test within normal limits  bilaterally. Muscle power within normal limits bilaterally.  Nails normal nails noted bilaterally.  The nail plate on the second toe. The right foot has self avulsed and there is no evidence of any redness, swelling or infection.  Orthopedic  No limitations of motion of motion feet .  No crepitus or effusions noted.  HAV 1st MPJ  B/L.  Skin  normotropic skin with no porokeratosis noted bilaterally.  No signs of infections or ulcers noted.  Nail Injury second toe right foot.    IE.  Discussed her self avulsing nail problem and told her that a new nail will regrow and that she needs to be patient and wait for approximately 10 months for the nail to regrow.  RTC prn   Helane GuntherGregory Alexandria Current DPM

## 2017-11-24 ENCOUNTER — Telehealth: Payer: Self-pay | Admitting: *Deleted

## 2017-11-24 NOTE — Telephone Encounter (Signed)
I informed pt of Dr. Gabriel RungWagoner's recommendation and she stated there is no nail to get a specimen. Pt's states the 2nd and 5th nails looked similar and I told pt she could have the 5th toenails tested and transferred to scheduler.

## 2017-11-24 NOTE — Telephone Encounter (Signed)
Pt states she had the right 2nd toenail that fell off and then again yesterday the left 2nd toenail fell off, the doctor told me that it just happens. Pt states toenails just don't fall off. Pt states no test was offered on the toenail or any treatment, and the doctor was very nonchalant. I asked pt if she was active in sports, or vigorous walking, if she possible wore her shoes too tight, high heels or had one toe that tapped the end of bottom of the shoe, she denied. I explained to pt, the questions I asked may have been indicator of the nail lose, but I would ask another doctor and call again.

## 2017-11-24 NOTE — Telephone Encounter (Signed)
[facility] is going on [facility] nail. She can follow-up with me or Dr. Stacie AcresMayer or any doctor for this.

## 2017-11-25 ENCOUNTER — Encounter: Payer: Self-pay | Admitting: Podiatry

## 2017-11-25 ENCOUNTER — Ambulatory Visit: Payer: BLUE CROSS/BLUE SHIELD | Admitting: Podiatry

## 2017-11-25 DIAGNOSIS — L608 Other nail disorders: Secondary | ICD-10-CM

## 2017-11-27 NOTE — Progress Notes (Signed)
   HPI: 52 year old female presenting today with a chief complaint of a loss of the left 2nd toenail that occurred two days ago. She denies any injury or pain. She states the same thing happened to the right 2nd toe in the past. Patient is here for further evaluation and treatment.   History reviewed. No pertinent past medical history.   Physical Exam: General: The patient is alert and oriented x3 in no acute distress.  Dermatology: Skin is warm, dry and supple bilateral lower extremities. Negative for open lesions or macerations.  Vascular: Palpable pedal pulses bilaterally. No edema or erythema noted. Capillary refill within normal limits.  Neurological: Epicritic and protective threshold grossly intact bilaterally.   Musculoskeletal Exam: Range of motion within normal limits to all pedal and ankle joints bilateral. Muscle strength 5/5 in all groups bilateral.   Assessment: 1. 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 Patient evaluated.  2. Assured patient that her toenail issue is likely due to trauma and not fungal infection.  3. Recommended good foot hygiene.  4. Return to clinic as needed.     Felecia ShellingBrent M. Jaeleen Inzunza, DPM [facility]  Dr. Felecia ShellingBrent M. Ferdie Bakken, DPM    [address]                                        [place], [place] [address]                Office [phone]  Fax [phone]

## 2019-03-12 ENCOUNTER — Encounter: Payer: Self-pay | Admitting: Podiatry

## 2019-03-12 ENCOUNTER — Other Ambulatory Visit: Payer: Self-pay

## 2019-03-12 ENCOUNTER — Ambulatory Visit (INDEPENDENT_AMBULATORY_CARE_PROVIDER_SITE_OTHER): Payer: BLUE CROSS/BLUE SHIELD | Admitting: Podiatry

## 2019-03-12 DIAGNOSIS — L600 Ingrowing nail: Secondary | ICD-10-CM

## 2019-03-12 MED ORDER — GENTAMICIN SULFATE 0.1 % EX CREA: 1.0000 "application " | TOPICAL_CREAM | Freq: Two times a day (BID) | CUTANEOUS | 1 refills | Status: DC

## 2019-03-12 MED ORDER — GENTAMICIN SULFATE 0.1 % EX CREA: 1.0000 "application " | TOPICAL_CREAM | Freq: Two times a day (BID) | CUTANEOUS | 1 refills | Status: AC

## 2019-03-12 NOTE — Patient Instructions (Signed)

## 2019-03-16 NOTE — Progress Notes (Signed)
   Subjective: Patient presents today for evaluation of pain to the medial border of the left great toe that began two days ago. She reports associated redness and purulent drainage. Patient is concerned for possible ingrown nail. Applying pressure to the toe increases the pain. She has not had any treatment for the symptoms but reports having a nail avulsion procedure in the past. Patient presents today for further treatment and evaluation.  History reviewed. No pertinent past medical history.  Objective:  General: Well developed, nourished, in no acute distress, alert and oriented x3   Dermatology: Skin is warm, dry and supple bilateral. Medial border left hallux appears to be erythematous with evidence of an ingrowing nail. Pain on palpation noted to the border of the nail fold. The remaining nails appear unremarkable at this time. There are no open sores, lesions.  Vascular: Dorsalis Pedis artery and Posterior Tibial artery pedal pulses palpable. No lower extremity edema noted.   Neruologic: Grossly intact via light touch bilateral.  Musculoskeletal: Muscular strength within normal limits in all groups bilateral. Normal range of motion noted to all pedal and ankle joints.   Assesement: #1 Paronychia with ingrowing nail medial border left hallux #2 Pain in toe #3 Incurvated nail  Plan of Care:  1. Patient evaluated.  2. Discussed treatment alternatives and plan of care. Explained nail avulsion procedure and post procedure course to patient. 3. Patient opted for permanent partial nail avulsion of the medial border left hallux.  4. Prior to procedure, local anesthesia infiltration utilized using 3 ml of a 50:50 mixture of 2% plain lidocaine and 0.5% plain marcaine in a normal hallux block fashion and a betadine prep performed.  5. Partial permanent nail avulsion with chemical matrixectomy performed using [id number] applications of phenol followed by alcohol flush.  6. Light dressing  applied. 7. Prescription for Gentamicin cream provided to patient to use daily with a bandage.  8. Return to clinic in 2 weeks.  Edrick Kins, DPM [facility]  Dr. Edrick Kins, [address]                                        [place], [place] [address]                Office [phone]  Fax [phone]

## 2019-03-30 ENCOUNTER — Encounter: Payer: Self-pay | Admitting: Podiatry

## 2019-03-30 ENCOUNTER — Other Ambulatory Visit: Payer: Self-pay

## 2019-03-30 ENCOUNTER — Ambulatory Visit: Payer: BLUE CROSS/BLUE SHIELD | Admitting: Podiatry

## 2019-03-30 DIAGNOSIS — L600 Ingrowing nail: Secondary | ICD-10-CM | POA: Diagnosis not present

## 2019-03-30 DIAGNOSIS — M79676 Pain in unspecified toe(s): Secondary | ICD-10-CM

## 2019-03-30 DIAGNOSIS — B351 Tinea unguium: Secondary | ICD-10-CM

## 2019-04-05 NOTE — Progress Notes (Signed)
   Subjective: 53 y.o. female presents today status post permanent partial nail avulsion procedure of the medial border of the left great toe that was performed on 03/12/2019. She states she is doing very well and denies any pain or modifying factors. She has been applying the Gentamicin cream to the area as directed.  She also complains of elongated, thickened nails 1-5 of the bilateral feet that cause pain while ambulating in shoes. She is unable to trim her own nails. Patient is here for further evaluation and treatment.    History reviewed. No pertinent past medical history.  Objective: Skin is warm, dry and supple. Nail and respective nail fold appears to be healing appropriately. Open wound to the associated nail fold with a granular wound base and moderate amount of fibrotic tissue. Minimal drainage noted. Mild erythema around the periungual region likely due to phenol chemical matricectomy. Nails are tender, long, thickened and dystrophic with subungual debris, consistent with onychomycosis, 1-5 bilateral. No signs of infection noted.   Assessment: #1 postop permanent partial nail avulsion medial border left hallux  #2 open wound periungual nail fold of respective digit.  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 Antibiotic ointment and Band-Aid was applied. #3 patient is to return to clinic on a PRN basis.   Edrick Kins, DPM [facility]  Dr. Edrick Kins, [address]                                        [place], [place] [address]                Office [phone]  Fax [phone]

## 2019-06-11 ENCOUNTER — Telehealth: Payer: Self-pay | Admitting: Podiatry

## 2019-06-11 ENCOUNTER — Telehealth: Payer: Self-pay | Admitting: *Deleted

## 2019-06-11 NOTE — Telephone Encounter (Signed)
Pt states she had a toenail procedure in 03/2019 and is now having the same problem in the same area and she wants to be seen next week to have this toe taken care of. I told pt I was having difficulty hearing her due to noise, or a chair scraping in the background and to take me off of speaker phone. Pt states she is not on speaker phone she is in the new empty house she just built in [facility]. I told pt that maybe it was the empty house sounds but I was having difficulty understanding. I told pt that I would needed to transfer to the scheduler but we would get her in next week as she requested, I instructed her to continue the epsom salt soak. Pt interrupted me and said she was performing the soaks and she wanted to get in to be seen next week. I told her I would have to get her appt with the scheduler but to continue the soaking and antibiotic dressing until seen. Transferred pt to scheduler.

## 2019-06-11 NOTE — Telephone Encounter (Signed)
Holly Clarke, when calling back, please call me [name]. Phone number is [phone]. Guarantor# [id number]. Thank you.

## 2019-06-15 ENCOUNTER — Ambulatory Visit: Payer: BLUE CROSS/BLUE SHIELD | Admitting: Podiatry

## 2019-06-17 ENCOUNTER — Telehealth: Payer: Self-pay | Admitting: Podiatry

## 2019-06-17 NOTE — Telephone Encounter (Signed)
Hi [name], my name is Alessa Mazur, when calling back, please ask to speak to Ms. Medal. The contact number is [phone]. Calling to get copies of my medical records. I had to change physicians due to [facility] not being within network of my medical insurance. Again, my name is [name], I'm needing my medical records. My contact number is [phone]. Thank you.

## 2019-06-17 NOTE — Telephone Encounter (Signed)
Left voicemail letting Holly Clarke know she would need to fill out and sign a medical records release form. Told her we could e-mail one, fax one, or she could come by the [place] office. Told pt to call me back with any other questions.

## 2019-06-17 NOTE — Telephone Encounter (Signed)
Pt called back and stated she would come by the [facility] office to fill out and sign the medical records release form. Stated she would have the office print out her visits when she came in for the form, which probably would not be until next Wednesday.

## 2019-07-15 ENCOUNTER — Telehealth: Payer: Self-pay | Admitting: Podiatry

## 2019-07-15 NOTE — Telephone Encounter (Signed)
This is Holly Clarke, when calling back, please ask to speak to [name]. Account number is [id number]. I have already set up arrangements for a bill. I was calling to see if this is the same bill because it says $1,005. So if someone could please call me back at [phone] I would greatly appreciate it.  Also, I have some concerns about the  Documentation noted on 03/30/2019. It says debridement of nails 6 or more and I only had one toe done in his office.

## 2022-08-11 ENCOUNTER — Other Ambulatory Visit: Payer: Self-pay

## 2022-08-11 DIAGNOSIS — Y92410 Unspecified street and highway as the place of occurrence of the external cause: Secondary | ICD-10-CM | POA: Diagnosis not present

## 2022-08-11 DIAGNOSIS — S39012A Strain of muscle, fascia and tendon of lower back, initial encounter: Secondary | ICD-10-CM | POA: Insufficient documentation

## 2022-08-11 DIAGNOSIS — S161XXA Strain of muscle, fascia and tendon at neck level, initial encounter: Secondary | ICD-10-CM | POA: Insufficient documentation

## 2022-08-11 DIAGNOSIS — M542 Cervicalgia: Secondary | ICD-10-CM | POA: Diagnosis present
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
## 2022-08-12 ENCOUNTER — Emergency Department
Admission: EM | Admit: 2022-08-12 | Discharge: 2022-08-12 | Disposition: A | Discharge status: Home / Self Care | Payer: BLUE CROSS/BLUE SHIELD | Attending: Emergency Medicine | Admitting: Emergency Medicine

## 2022-08-12 DIAGNOSIS — S39012A Strain of muscle, fascia and tendon of lower back, initial encounter: Secondary | ICD-10-CM

## 2022-08-12 DIAGNOSIS — S161XXA Strain of muscle, fascia and tendon at neck level, initial encounter: Secondary | ICD-10-CM

## 2022-08-12 HISTORY — DX: Unspecified asthma, uncomplicated: J45.909

## 2022-08-12 MED ORDER — MUSCLE RUB 10-15 % EX CREA: 1.0000 | TOPICAL_CREAM | CUTANEOUS | 0 refills | Status: AC | PRN

## 2022-08-12 MED ORDER — METHOCARBAMOL 750 MG PO TABS: 750.0000 mg | ORAL_TABLET | Freq: Four times a day (QID) | ORAL | 0 refills | Status: AC

## 2022-08-12 MED ORDER — ACETAMINOPHEN 500 MG PO TABS
1000.0000 mg | ORAL_TABLET | Freq: Once | ORAL | Status: AC
  Administered 2022-08-12: 1000 mg via ORAL
  Filled 2022-08-12: qty 2

## 2022-08-12 MED ORDER — LIDOCAINE 5 % EX PTCH
3.0000 | MEDICATED_PATCH | CUTANEOUS | Status: DC
  Administered 2022-08-12: 3 via TRANSDERMAL
  Filled 2022-08-12: qty 3

## 2022-08-12 NOTE — Discharge Instructions (Signed)
Take Tylenol 650 mg every 6 hours with food.  Take Robaxin as needed for muscle spasms.  Use muscle rub on your areas of soreness and stiffness.  See your doctor this week for a follow-up visit.
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
